# Patient Record
Sex: Female | Born: 1945 | Race: White | Hispanic: No | Marital: Married | Smoking: Former smoker
Health system: Southern US, Community
[De-identification: ages and names within clinical notes are randomized; demographics above are authoritative.]

## PROBLEM LIST (undated history)

## (undated) DIAGNOSIS — M1990 Unspecified osteoarthritis, unspecified site: Secondary | ICD-10-CM

## (undated) DIAGNOSIS — A0472 Enterocolitis due to Clostridium difficile, not specified as recurrent: Secondary | ICD-10-CM

## (undated) DIAGNOSIS — K219 Gastro-esophageal reflux disease without esophagitis: Secondary | ICD-10-CM

## (undated) DIAGNOSIS — B029 Zoster without complications: Secondary | ICD-10-CM

## (undated) DIAGNOSIS — K589 Irritable bowel syndrome without diarrhea: Secondary | ICD-10-CM

## (undated) DIAGNOSIS — I10 Essential (primary) hypertension: Secondary | ICD-10-CM

## (undated) DIAGNOSIS — J45909 Unspecified asthma, uncomplicated: Secondary | ICD-10-CM

## (undated) DIAGNOSIS — K5792 Diverticulitis of intestine, part unspecified, without perforation or abscess without bleeding: Secondary | ICD-10-CM

## (undated) DIAGNOSIS — R1032 Left lower quadrant pain: Secondary | ICD-10-CM

## (undated) DIAGNOSIS — G4730 Sleep apnea, unspecified: Secondary | ICD-10-CM

## (undated) DIAGNOSIS — R1011 Right upper quadrant pain: Secondary | ICD-10-CM

## (undated) HISTORY — PX: BACK SURGERY: SHX140

## (undated) HISTORY — PX: CHOLECYSTECTOMY: SHX55

## (undated) HISTORY — PX: EYE SURGERY: SHX253

## (undated) HISTORY — PX: CATARACT EXTRACTION, BILATERAL: SHX1313

---

## 2004-04-29 ENCOUNTER — Ambulatory Visit: Payer: Self-pay

## 2005-05-26 ENCOUNTER — Inpatient Hospital Stay: Payer: Self-pay | Admitting: Unknown Physician Specialty

## 2005-06-15 ENCOUNTER — Ambulatory Visit: Payer: Self-pay | Admitting: Unknown Physician Specialty

## 2005-09-06 ENCOUNTER — Ambulatory Visit: Payer: Self-pay

## 2006-09-07 ENCOUNTER — Ambulatory Visit: Payer: Self-pay

## 2007-10-01 ENCOUNTER — Ambulatory Visit: Payer: Self-pay

## 2008-01-10 ENCOUNTER — Ambulatory Visit: Payer: Self-pay | Admitting: Family Medicine

## 2008-02-08 ENCOUNTER — Ambulatory Visit: Payer: Self-pay | Admitting: Family Medicine

## 2008-07-31 ENCOUNTER — Ambulatory Visit: Payer: Self-pay

## 2008-10-27 ENCOUNTER — Ambulatory Visit: Payer: Self-pay

## 2010-02-01 ENCOUNTER — Ambulatory Visit: Payer: Self-pay

## 2010-02-19 ENCOUNTER — Ambulatory Visit: Payer: Self-pay

## 2010-03-16 ENCOUNTER — Ambulatory Visit: Payer: Self-pay | Admitting: Unknown Physician Specialty

## 2010-03-17 LAB — PATHOLOGY REPORT

## 2011-02-22 ENCOUNTER — Ambulatory Visit: Payer: Self-pay | Admitting: Obstetrics and Gynecology

## 2012-06-20 ENCOUNTER — Ambulatory Visit: Payer: Self-pay | Admitting: Obstetrics and Gynecology

## 2013-06-25 ENCOUNTER — Ambulatory Visit: Payer: Self-pay | Admitting: Physical Medicine and Rehabilitation

## 2013-10-08 ENCOUNTER — Ambulatory Visit: Payer: Self-pay | Admitting: Internal Medicine

## 2013-10-23 HISTORY — PX: COLONOSCOPY: SHX174

## 2014-02-27 ENCOUNTER — Other Ambulatory Visit: Payer: Self-pay | Admitting: Ophthalmology

## 2014-03-02 LAB — WOUND AEROBIC CULTURE

## 2014-08-30 ENCOUNTER — Ambulatory Visit: Payer: Self-pay | Admitting: Registered Nurse

## 2015-02-17 ENCOUNTER — Other Ambulatory Visit: Payer: Self-pay | Admitting: Internal Medicine

## 2015-02-17 DIAGNOSIS — Z1231 Encounter for screening mammogram for malignant neoplasm of breast: Secondary | ICD-10-CM

## 2015-02-25 ENCOUNTER — Ambulatory Visit
Admission: RE | Admit: 2015-02-25 | Discharge: 2015-02-25 | Disposition: A | Discharge status: Home / Self Care | Payer: Medicare Other | Source: Ambulatory Visit | Attending: Internal Medicine | Admitting: Internal Medicine

## 2015-02-25 DIAGNOSIS — Z1231 Encounter for screening mammogram for malignant neoplasm of breast: Secondary | ICD-10-CM

## 2017-02-07 ENCOUNTER — Other Ambulatory Visit: Payer: Self-pay | Admitting: Internal Medicine

## 2017-02-07 DIAGNOSIS — Z1231 Encounter for screening mammogram for malignant neoplasm of breast: Secondary | ICD-10-CM

## 2017-02-20 ENCOUNTER — Ambulatory Visit
Admission: RE | Admit: 2017-02-20 | Discharge: 2017-02-20 | Disposition: A | Discharge status: Home / Self Care | Payer: Medicare HMO | Source: Ambulatory Visit | Attending: Internal Medicine | Admitting: Internal Medicine

## 2017-02-20 DIAGNOSIS — Z1231 Encounter for screening mammogram for malignant neoplasm of breast: Secondary | ICD-10-CM

## 2017-03-30 ENCOUNTER — Other Ambulatory Visit: Payer: Self-pay | Admitting: Internal Medicine

## 2017-03-30 DIAGNOSIS — I6523 Occlusion and stenosis of bilateral carotid arteries: Secondary | ICD-10-CM

## 2017-04-10 ENCOUNTER — Ambulatory Visit
Admission: RE | Admit: 2017-04-10 | Discharge: 2017-04-10 | Disposition: A | Discharge status: Home / Self Care | Payer: Medicare HMO | Source: Ambulatory Visit | Attending: Internal Medicine | Admitting: Internal Medicine

## 2017-04-10 DIAGNOSIS — I6523 Occlusion and stenosis of bilateral carotid arteries: Secondary | ICD-10-CM | POA: Insufficient documentation

## 2017-10-29 ENCOUNTER — Other Ambulatory Visit: Payer: Self-pay

## 2017-10-29 ENCOUNTER — Encounter: Payer: Self-pay | Admitting: Gynecology

## 2017-10-29 ENCOUNTER — Ambulatory Visit
Admission: EM | Admit: 2017-10-29 | Discharge: 2017-10-29 | Disposition: A | Discharge status: Home / Self Care | Payer: Medicare HMO | Attending: Family Medicine | Admitting: Family Medicine

## 2017-10-29 DIAGNOSIS — R059 Cough, unspecified: Secondary | ICD-10-CM

## 2017-10-29 DIAGNOSIS — R05 Cough: Secondary | ICD-10-CM

## 2017-10-29 DIAGNOSIS — J0140 Acute pansinusitis, unspecified: Secondary | ICD-10-CM

## 2017-10-29 HISTORY — DX: Enterocolitis due to Clostridium difficile, not specified as recurrent: A04.72

## 2017-10-29 HISTORY — DX: Essential (primary) hypertension: I10

## 2017-10-29 HISTORY — DX: Zoster without complications: B02.9

## 2017-10-29 HISTORY — DX: Diverticulitis of intestine, part unspecified, without perforation or abscess without bleeding: K57.92

## 2017-10-29 MED ORDER — AMOXICILLIN-POT CLAVULANATE 875-125 MG PO TABS: 1.0000 | ORAL_TABLET | Freq: Two times a day (BID) | ORAL | 0 refills | Status: AC

## 2017-10-29 MED ORDER — FLUCONAZOLE 150 MG PO TABS: 150.0000 mg | ORAL_TABLET | Freq: Once | ORAL | 0 refills | Status: AC

## 2017-10-29 NOTE — Discharge Instructions (Addendum)
Recommend start Augmentin 875mg  twice a day as directed. Increase fluid intake to help loosen up mucus. May continue Mucinex as needed for cough. Also recommend take Diflucan 150mg  one tablet tomorrow, may repeat 1 tablet at end of antibiotic use if needed. Follow-up with your PCP in 3 to 4 days if not improving.

## 2017-10-29 NOTE — ED Triage Notes (Signed)
Patient c/o cough x couple days.

## 2017-10-29 NOTE — ED Provider Notes (Signed)
[facility]    CSN: [id number] Arrival date & time: 10/29/17  1311     History   Chief Complaint Chief Complaint  Patient presents with  . Cough    HPI Julie Davis is a 72 y.o. female.   72 year old female accompanied by her daughter with concern over nasal congestion and cough that has been occurring for the past 2 weeks. Thought is may be allergies at first but now cough and congestion have gotten worse over the past 2-3 days. Also has had decreased energy and appetite along with nausea and irritated throat from coughing. Denies any fever or vomiting. Has taken Mucinex and Aleve or Tylenol OTC cold medication with minimal relief. Other chronic health issues include HTN, Diverticulitis and Osteoporosis along with dementia. Currently on Lisinopril-HCTZ, Toprol, Fosamax, Aricept, Protonix and Xanax prn.   The history is provided by the patient and a caregiver.    Past Medical History:  Diagnosis Date  . C. difficile colitis   . Diverticulitis   . Hypertension   . Shingles     There are no active problems to display for this patient.   Past Surgical History:  Procedure Laterality Date  . CHOLECYSTECTOMY    . EYE SURGERY      OB History   None      Home Medications    Prior to Admission medications   Medication Sig Start Date End Date Taking? Authorizing Provider  alendronate (FOSAMAX) 70 MG tablet Take 70 mg by mouth once a week. Take with a full glass of water on an empty stomach.   Yes [provider]  ALPRAZolam (XANAX) 0.25 MG tablet Take 0.25 mg by mouth at bedtime as needed for anxiety.   Yes [provider]  donepezil (ARICEPT) 5 MG tablet Take 5 mg by mouth at bedtime.   Yes [provider]  lisinopril-hydrochlorothiazide (PRINZIDE,ZESTORETIC) 20-12.5 MG tablet Take 1 tablet by mouth daily.   Yes [provider]  methocarbamol (ROBAXIN) 500 MG tablet Take 500 mg by mouth 4 (four) times daily.   Yes  [provider]  metoprolol succinate (TOPROL-XL) 50 MG 24 hr tablet Take 50 mg by mouth daily. Take with or immediately following a meal.   Yes [provider]  pantoprazole (PROTONIX) 40 MG tablet Take 40 mg by mouth daily.   Yes [provider]  traMADol (ULTRAM) 50 MG tablet Take by mouth every 6 (six) hours as needed.   Yes [provider]  amoxicillin-clavulanate (AUGMENTIN) 875-125 MG tablet Take 1 tablet by mouth every 12 (twelve) hours for 7 days. 10/29/17 11/05/17  Sudie Grumbling, NP  fluconazole (DIFLUCAN) 150 MG tablet Take 1 tablet (150 mg total) by mouth once for 1 dose. May repeat 1 tablet in 3 to 4 days if needed. 10/29/17 10/29/17  Sudie Grumbling, NP    Family History Family History  Problem Relation Age of Onset  . Breast cancer Father 45    Social History Social History   Tobacco Use  . Smoking status: Never Smoker  . Smokeless tobacco: Never Used  Substance Use Topics  . Alcohol use: Never    Frequency: Never  . Drug use: Never     Allergies   Patient has no known allergies.   Review of Systems Review of Systems  Constitutional: Positive for activity change, appetite change and fatigue. Negative for chills, diaphoresis and fever.  HENT: Positive for congestion, postnasal drip, rhinorrhea, sinus pressure, sinus  pain and sore throat. Negative for ear discharge, ear pain, facial swelling, mouth sores, nosebleeds and trouble swallowing.   Eyes: Negative for pain, discharge, redness and itching.  Respiratory: Positive for cough. Negative for chest tightness, shortness of breath and wheezing.   Gastrointestinal: Positive for nausea. Negative for abdominal pain, diarrhea and vomiting.  Musculoskeletal: Negative for arthralgias, myalgias, neck pain and neck stiffness.  Skin: Negative for rash and wound.  Neurological: Positive for headaches. Negative for dizziness, seizures, syncope, weakness and light-headedness.  Hematological:  Negative for adenopathy. Does not bruise/bleed easily.  Psychiatric/Behavioral: Positive for confusion.     Physical Exam Triage Vital Signs ED Triage Vitals  Enc Vitals Group     BP 10/29/17 1326 (!) 151/118     Pulse Rate 10/29/17 1326 (!) 50     Resp 10/29/17 1326 16     Temp 10/29/17 1326 98 F (36.7 C)     Temp Source 10/29/17 1326 Oral     SpO2 10/29/17 1326 98 %     Weight 10/29/17 1328 160 lb (72.6 kg)     Height 10/29/17 1328 5\' 3"  (1.6 m)     Head Circumference --      Peak Flow --      Pain Score 10/29/17 1327 1     Pain Loc --      Pain Edu? --      Excl. in GC? --    No data found.  Updated Vital Signs BP (!) 151/118 (BP Location: Right Arm)   Pulse (!) 50   Temp 98 F (36.7 C) (Oral)   Resp 16   Ht 5\' 3"  (1.6 m)   Wt 160 lb (72.6 kg)   SpO2 98%   BMI 28.34 kg/m   Visual Acuity Right Eye Distance:   Left Eye Distance:   Bilateral Distance:    Right Eye Near:   Left Eye Near:    Bilateral Near:     Physical Exam  Constitutional: She appears well-developed and well-nourished. She is cooperative. No distress.  Patient sitting in exam chair in no acute distress.   HENT:  Head: Normocephalic and atraumatic.  Right Ear: Hearing, tympanic membrane, external ear and ear canal normal.  Left Ear: Hearing, tympanic membrane, external ear and ear canal normal.  Nose: Mucosal edema and rhinorrhea present. Right sinus exhibits maxillary sinus tenderness and frontal sinus tenderness. Left sinus exhibits maxillary sinus tenderness and frontal sinus tenderness.  Mouth/Throat: Uvula is midline and mucous membranes are normal. Oropharyngeal exudate (yellow) and posterior oropharyngeal erythema present.  Yellowish brown cerumen present in both ear canals but not completely blocking canal.   Eyes: Conjunctivae and EOM are normal.  Neck: Normal range of motion. Neck supple.  Cardiovascular: Normal rate, regular rhythm and normal heart sounds.  No murmur  heard. Pulmonary/Chest: Effort normal. No stridor. No respiratory distress. She has no decreased breath sounds. She has no wheezes. She has rhonchi in the right upper field and the left upper field. She has no rales.  Musculoskeletal: Normal range of motion.  Lymphadenopathy:    She has no cervical adenopathy.  Neurological: She is alert.  Skin: Skin is warm and dry. Capillary refill takes less than 2 seconds. No rash noted.  Psychiatric: She has a normal mood and affect. Her speech is normal and behavior is normal. Thought content normal.  Vitals reviewed.    UC Treatments / Results  Labs (all labs ordered are listed, but only abnormal results are displayed) Labs  Reviewed - No data to display  EKG None Radiology No results found.  Procedures Procedures (including critical care time)  Medications Ordered in UC Medications - No data to display   Initial Impression / Assessment and Plan / UC Course  I have reviewed the triage vital signs and the nursing notes.  Pertinent labs & imaging results that were available during my care of the patient were reviewed by me and considered in my medical decision making (see chart for details).    Discussed with patient and daughter that she probably has an early sinus infection. Since symptoms are getting worse will treat with antibiotics. May start Augmentin 875mg  twice a day as directed. May take Diflucan 150mg  once tomorrow to help prevent antibiotic-induced yeast vaginitis. May repeat 1 tablet at end of antibiotic use if needed. May continue Mucinex as directed for cough. Increase fluid intake to help loosen up mucus. Follow-up with her PCP in 3 to 4 days if not improving.   Final Clinical Impressions(s) / UC Diagnoses   Final diagnoses:  Acute non-recurrent pansinusitis  Cough    ED Discharge Orders        Ordered    amoxicillin-clavulanate (AUGMENTIN) 875-125 MG tablet  Every 12 hours     10/29/17 1349    fluconazole (DIFLUCAN)  150 MG tablet   Once     10/29/17 1349       Controlled Substance Prescriptions [place] Controlled Substance Registry consulted? Not Applicable   12/29/17, NP 10/29/17 1404

## 2018-04-13 ENCOUNTER — Other Ambulatory Visit: Payer: Self-pay | Admitting: Internal Medicine

## 2018-04-13 DIAGNOSIS — Z1231 Encounter for screening mammogram for malignant neoplasm of breast: Secondary | ICD-10-CM

## 2018-04-18 ENCOUNTER — Ambulatory Visit
Admission: RE | Admit: 2018-04-18 | Discharge: 2018-04-18 | Disposition: A | Discharge status: Home / Self Care | Payer: Medicare HMO | Source: Ambulatory Visit | Attending: Internal Medicine | Admitting: Internal Medicine

## 2018-04-18 DIAGNOSIS — Z1231 Encounter for screening mammogram for malignant neoplasm of breast: Secondary | ICD-10-CM | POA: Insufficient documentation

## 2018-04-19 ENCOUNTER — Other Ambulatory Visit: Payer: Self-pay | Admitting: Nurse Practitioner

## 2018-04-19 DIAGNOSIS — R1032 Left lower quadrant pain: Secondary | ICD-10-CM

## 2018-04-25 ENCOUNTER — Ambulatory Visit
Admission: RE | Admit: 2018-04-25 | Discharge: 2018-04-25 | Disposition: A | Discharge status: Home / Self Care | Payer: Medicare HMO | Source: Ambulatory Visit | Attending: Nurse Practitioner | Admitting: Nurse Practitioner

## 2018-04-25 DIAGNOSIS — R1032 Left lower quadrant pain: Secondary | ICD-10-CM | POA: Diagnosis present

## 2018-04-25 DIAGNOSIS — K760 Fatty (change of) liver, not elsewhere classified: Secondary | ICD-10-CM | POA: Diagnosis not present

## 2018-04-25 DIAGNOSIS — R1011 Right upper quadrant pain: Secondary | ICD-10-CM | POA: Insufficient documentation

## 2018-04-25 DIAGNOSIS — K5730 Diverticulosis of large intestine without perforation or abscess without bleeding: Secondary | ICD-10-CM | POA: Insufficient documentation

## 2018-04-25 MED ORDER — IOHEXOL 300 MG/ML  SOLN
150.0000 mL | Freq: Once | INTRAMUSCULAR | Status: AC | PRN
  Administered 2018-04-25: 100 mL via INTRAVENOUS

## 2018-06-20 ENCOUNTER — Encounter: Payer: Self-pay | Admitting: *Deleted

## 2018-06-25 ENCOUNTER — Ambulatory Visit: Payer: Medicare HMO | Admitting: Anesthesiology

## 2018-06-25 ENCOUNTER — Encounter: Payer: Self-pay | Admitting: *Deleted

## 2018-06-25 ENCOUNTER — Encounter
Admission: RE | Disposition: A | Discharge status: Home / Self Care | Payer: Self-pay | Source: Ambulatory Visit | Attending: Unknown Physician Specialty

## 2018-06-25 ENCOUNTER — Ambulatory Visit
Admission: RE | Admit: 2018-06-25 | Discharge: 2018-06-25 | Disposition: A | Discharge status: Home / Self Care | Payer: Medicare HMO | Source: Ambulatory Visit | Attending: Unknown Physician Specialty | Admitting: Unknown Physician Specialty

## 2018-06-25 DIAGNOSIS — K269 Duodenal ulcer, unspecified as acute or chronic, without hemorrhage or perforation: Secondary | ICD-10-CM | POA: Diagnosis not present

## 2018-06-25 DIAGNOSIS — Z1211 Encounter for screening for malignant neoplasm of colon: Secondary | ICD-10-CM | POA: Insufficient documentation

## 2018-06-25 DIAGNOSIS — Z86010 Personal history of colonic polyps: Secondary | ICD-10-CM | POA: Insufficient documentation

## 2018-06-25 DIAGNOSIS — Z87891 Personal history of nicotine dependence: Secondary | ICD-10-CM | POA: Diagnosis not present

## 2018-06-25 DIAGNOSIS — K219 Gastro-esophageal reflux disease without esophagitis: Secondary | ICD-10-CM | POA: Insufficient documentation

## 2018-06-25 DIAGNOSIS — M1990 Unspecified osteoarthritis, unspecified site: Secondary | ICD-10-CM | POA: Diagnosis not present

## 2018-06-25 DIAGNOSIS — J45909 Unspecified asthma, uncomplicated: Secondary | ICD-10-CM | POA: Insufficient documentation

## 2018-06-25 DIAGNOSIS — Z791 Long term (current) use of non-steroidal anti-inflammatories (NSAID): Secondary | ICD-10-CM | POA: Diagnosis not present

## 2018-06-25 DIAGNOSIS — I10 Essential (primary) hypertension: Secondary | ICD-10-CM | POA: Insufficient documentation

## 2018-06-25 DIAGNOSIS — K640 First degree hemorrhoids: Secondary | ICD-10-CM | POA: Diagnosis not present

## 2018-06-25 DIAGNOSIS — G4730 Sleep apnea, unspecified: Secondary | ICD-10-CM | POA: Diagnosis not present

## 2018-06-25 DIAGNOSIS — K2980 Duodenitis without bleeding: Secondary | ICD-10-CM | POA: Diagnosis not present

## 2018-06-25 DIAGNOSIS — K2970 Gastritis, unspecified, without bleeding: Secondary | ICD-10-CM | POA: Insufficient documentation

## 2018-06-25 DIAGNOSIS — K5730 Diverticulosis of large intestine without perforation or abscess without bleeding: Secondary | ICD-10-CM | POA: Insufficient documentation

## 2018-06-25 HISTORY — DX: Unspecified asthma, uncomplicated: J45.909

## 2018-06-25 HISTORY — PX: COLONOSCOPY WITH PROPOFOL: SHX5780

## 2018-06-25 HISTORY — DX: Sleep apnea, unspecified: G47.30

## 2018-06-25 HISTORY — DX: Right upper quadrant pain: R10.11

## 2018-06-25 HISTORY — DX: Gastro-esophageal reflux disease without esophagitis: K21.9

## 2018-06-25 HISTORY — DX: Left lower quadrant pain: R10.32

## 2018-06-25 HISTORY — DX: Irritable bowel syndrome, unspecified: K58.9

## 2018-06-25 HISTORY — PX: ESOPHAGOGASTRODUODENOSCOPY (EGD) WITH PROPOFOL: SHX5813

## 2018-06-25 HISTORY — DX: Unspecified osteoarthritis, unspecified site: M19.90

## 2018-06-25 SURGERY — ESOPHAGOGASTRODUODENOSCOPY (EGD) WITH PROPOFOL
Anesthesia: General

## 2018-06-25 MED ORDER — SODIUM CHLORIDE 0.9 % IV SOLN: INTRAVENOUS | Status: DC

## 2018-06-25 MED ORDER — FENTANYL CITRATE (PF) 100 MCG/2ML IJ SOLN
INTRAMUSCULAR | Status: AC
  Filled 2018-06-25: qty 2

## 2018-06-25 MED ORDER — LIDOCAINE HCL (PF) 2 % IJ SOLN
INTRAMUSCULAR | Status: AC
  Filled 2018-06-25: qty 10

## 2018-06-25 MED ORDER — MIDAZOLAM HCL 5 MG/5ML IJ SOLN
INTRAMUSCULAR | Status: DC | PRN
  Administered 2018-06-25: 0.5 mg via INTRAVENOUS
  Administered 2018-06-25: 1 mg via INTRAVENOUS
  Administered 2018-06-25: 0.5 mg via INTRAVENOUS

## 2018-06-25 MED ORDER — LIDOCAINE HCL (PF) 2 % IJ SOLN
INTRAMUSCULAR | Status: DC | PRN
  Administered 2018-06-25: 80 mg

## 2018-06-25 MED ORDER — PROPOFOL 10 MG/ML IV BOLUS
INTRAVENOUS | Status: DC | PRN
  Administered 2018-06-25: 10 mg via INTRAVENOUS
  Administered 2018-06-25: 30 mg via INTRAVENOUS
  Administered 2018-06-25 (×3): 20 mg via INTRAVENOUS

## 2018-06-25 MED ORDER — PROPOFOL 500 MG/50ML IV EMUL
INTRAVENOUS | Status: AC
  Filled 2018-06-25: qty 50

## 2018-06-25 MED ORDER — MIDAZOLAM HCL 2 MG/2ML IJ SOLN
INTRAMUSCULAR | Status: AC
  Filled 2018-06-25: qty 2

## 2018-06-25 MED ORDER — PROPOFOL 500 MG/50ML IV EMUL
INTRAVENOUS | Status: DC | PRN
  Administered 2018-06-25: 50 ug/kg/min via INTRAVENOUS

## 2018-06-25 MED ORDER — FENTANYL CITRATE (PF) 100 MCG/2ML IJ SOLN
INTRAMUSCULAR | Status: DC | PRN
  Administered 2018-06-25 (×2): 25 ug via INTRAVENOUS
  Administered 2018-06-25: 50 ug via INTRAVENOUS

## 2018-06-25 MED ORDER — SODIUM CHLORIDE 0.9 % IV SOLN
INTRAVENOUS | Status: DC
  Administered 2018-06-25: 14:00:00 via INTRAVENOUS
  Administered 2018-06-25: 1000 mL via INTRAVENOUS

## 2018-06-25 NOTE — Op Note (Signed)
[facility] Gastroenterology Patient Name: Julie Davis Procedure Date: 06/25/2018 1:46 PM MRN: [id number] Account #: [id number] Date of Birth: 19-Jan-1946 Admit Type: Outpatient Age: 72 Room: [facility] ENDO ROOM 1 Gender: Female Note Status: Finalized Procedure:            Upper GI endoscopy Indications:          Suspected gastro-esophageal reflux disease Providers:            Scot Jun, MD Referring MD:         Danella Penton, MD (Referring MD) Medicines:            Propofol per Anesthesia Complications:        No immediate complications. Procedure:            Pre-Anesthesia Assessment:                       - After reviewing the risks and benefits, the patient                        was deemed in satisfactory condition to undergo the                        procedure.                       After obtaining informed consent, the endoscope was                        passed under direct vision. Throughout the procedure,                        the patient's blood pressure, pulse, and oxygen                        saturations were monitored continuously. The Endoscope                        was introduced through the mouth, and advanced to the                        second part of duodenum. The upper GI endoscopy was                        accomplished without difficulty. The patient tolerated                        the procedure well. Findings:      The examined esophagus was normal. 38cm to GEJ.      Scattered minimal inflammation characterized by erythema, granularity       and linear erosions was found in the gastric body. Biopsies were taken       with a cold forceps for histology.      Few non-bleeding scattered cratered duodenal ulcers with no stigmata of       bleeding were found in the duodenal bulb. The largest lesion was 3 mm in       largest dimension. Biopsies were taken with a cold forceps for histology. Impression:           - Normal esophagus.                       -  Gastritis. Biopsied.                       - Multiple non-bleeding duodenal ulcers with no                        stigmata of bleeding. Biopsied. Recommendation:       - Await pathology results. Scot Jun, MD 06/25/2018 2:03:51 PM This report has been signed electronically. Number of Addenda: 0 Note Initiated On: 06/25/2018 1:46 PM      [facility]

## 2018-06-25 NOTE — Op Note (Signed)
[facility] Gastroenterology Patient Name: Julie Davis Procedure Date: 06/25/2018 1:45 PM MRN: [id number] Account #: [id number] Date of Birth: [birth year] Admit Type: Outpatient Age: 72 Room: [facility] ENDO ROOM 1 Gender: Female Note Status: Finalized Procedure:            Colonoscopy Indications:          High risk colon cancer surveillance: Personal history                        of colonic polyps Providers:            Scot Jun, MD Referring MD:         Danella Penton, MD (Referring MD) Medicines:            Propofol per Anesthesia Complications:        No immediate complications. Procedure:            Pre-Anesthesia Assessment:                       - After reviewing the risks and benefits, the patient                        was deemed in satisfactory condition to undergo the                        procedure.                       After obtaining informed consent, the colonoscope was                        passed under direct vision. Throughout the procedure,                        the patient's blood pressure, pulse, and oxygen                        saturations were monitored continuously. The                        Colonoscope was introduced through the anus and                        advanced to the the cecum, identified by appendiceal                        orifice and ileocecal valve. The colonoscopy was                        performed without difficulty. The patient tolerated the                        procedure well. The quality of the bowel preparation                        was excellent. Findings:      Multiple small and large-mouthed diverticula were found in the sigmoid       colon, descending colon and transverse colon.      The exam was otherwise without abnormality.      Internal hemorrhoids were found during endoscopy. The hemorrhoids were  small and Grade I (internal hemorrhoids that do not prolapse). Impression:           -  Diverticulosis in the sigmoid colon, in the                        descending colon and in the transverse colon.                       - The examination was otherwise normal.                       - Internal hemorrhoids.                       - No specimens collected. Recommendation:       - Repeat colonoscopy in 5 years for screening purposes. Scot Jun, MD 06/25/2018 2:23:41 PM This report has been signed electronically. Number of Addenda: 0 Note Initiated On: 06/25/2018 1:45 PM Scope Withdrawal Time: 0 hours 5 minutes 23 seconds  Total Procedure Duration: 0 hours 13 minutes 23 seconds       [facility]

## 2018-06-25 NOTE — Anesthesia Preprocedure Evaluation (Addendum)
Anesthesia Evaluation  Patient identified by MRN, date of birth, ID band Patient awake    Reviewed: Allergy & Precautions, H&P , NPO status , Patient's Chart, lab work & pertinent test results  Airway Mallampati: II       Dental  (+) Upper Dentures, Lower Dentures   Pulmonary sleep apnea , former smoker (quit 30 years ago),           Cardiovascular hypertension,      Neuro/Psych TIA (30 years ago)negative psych ROS   GI/Hepatic Neg liver ROS, GERD  ,  Endo/Other  negative endocrine ROS  Renal/GU negative Renal ROS  negative genitourinary   Musculoskeletal  (+) Arthritis ,   Abdominal   Peds  Hematology negative hematology ROS (+)   Anesthesia Other Findings Past Medical History: No date: Abdominal pain, LLQ No date: Abdominal pain, RUQ No date: Arthritis No date: Asthma No date: C. difficile colitis No date: Diverticulitis No date: GERD (gastroesophageal reflux disease) No date: Hypertension No date: IBS (irritable bowel syndrome) No date: Shingles No date: Sleep apnea  Past Surgical History: No date: BACK SURGERY     Comment:  laminectomy posterior lumbar facetectomy No date: CATARACT EXTRACTION, BILATERAL No date: CHOLECYSTECTOMY 10/2013: COLONOSCOPY No date: EYE SURGERY  BMI    Body Mass Index:  27.99 kg/m      Reproductive/Obstetrics negative OB ROS                            Anesthesia Physical Anesthesia Plan  ASA: III  Anesthesia Plan: General   Post-op Pain Management:    Induction:   PONV Risk Score and Plan: Propofol infusion and TIVA  Airway Management Planned: Natural Airway and Nasal Cannula  Additional Equipment:   Intra-op Plan:   Post-operative Plan:   Informed Consent: I have reviewed the patients History and Physical, chart, labs and discussed the procedure including the risks, benefits and alternatives for the proposed anesthesia with the  patient or authorized representative who has indicated his/her understanding and acceptance.   Dental Advisory Given  Plan Discussed with: Anesthesiologist  Anesthesia Plan Comments:         Anesthesia Quick Evaluation

## 2018-06-25 NOTE — Transfer of Care (Signed)
Immediate Anesthesia Transfer of Care Note  Patient: Julie Davis  Procedure(s) Performed: ESOPHAGOGASTRODUODENOSCOPY (EGD) WITH PROPOFOL (N/A ) COLONOSCOPY WITH PROPOFOL (N/A )  Patient Location: PACU  Anesthesia Type:General  Level of Consciousness: awake and alert   Airway & Oxygen Therapy: Patient Spontanous Breathing  Post-op Assessment: Report given to RN and Post -op Vital signs reviewed and stable  Post vital signs: Reviewed and stable  Last Vitals:  Vitals Value Taken Time  BP    Temp    Pulse 70 06/25/2018  2:23 PM  Resp 17 06/25/2018  2:24 PM  SpO2 93 % 06/25/2018  2:23 PM  Vitals shown include unvalidated device data.  Last Pain:  Vitals:   06/25/18 1222  TempSrc: Tympanic  PainSc: 0-No pain         Complications: No apparent anesthesia complications

## 2018-06-25 NOTE — Anesthesia Post-op Follow-up Note (Signed)
Anesthesia QCDR form completed.        

## 2018-06-25 NOTE — H&P (Signed)
Primary Care Physician:  Danella Penton, MD Primary Gastroenterologist:  Dr. Mechele Collin  Pre-Procedure History & Physical: HPI:  Julie Davis is a 72 y.o. female is here for an endoscopy and colonoscopy.   Past Medical History:  Diagnosis Date  . Abdominal pain, LLQ   . Abdominal pain, RUQ   . Arthritis   . Asthma   . C. difficile colitis   . Diverticulitis   . GERD (gastroesophageal reflux disease)   . Hypertension   . IBS (irritable bowel syndrome)   . Shingles   . Sleep apnea     Past Surgical History:  Procedure Laterality Date  . BACK SURGERY     laminectomy posterior lumbar facetectomy  . CATARACT EXTRACTION, BILATERAL    . CHOLECYSTECTOMY    . COLONOSCOPY  10/2013  . EYE SURGERY      Prior to Admission medications   Medication Sig Start Date End Date Taking? Authorizing Provider  alendronate (FOSAMAX) 70 MG tablet Take 70 mg by mouth once a week. Take with a full glass of water on an empty stomach.   Yes [provider]  ALPRAZolam (XANAX) 0.25 MG tablet Take 0.25 mg by mouth at bedtime as needed for anxiety.   Yes [provider]  atorvastatin (LIPITOR) 10 MG tablet Take 10 mg by mouth daily.   Yes [provider]  donepezil (ARICEPT) 5 MG tablet Take 5 mg by mouth at bedtime.   Yes [provider]  lisinopril-hydrochlorothiazide (PRINZIDE,ZESTORETIC) 20-12.5 MG tablet Take 1 tablet by mouth daily.   Yes [provider]  meloxicam (MOBIC) 15 MG tablet Take 15 mg by mouth daily.   Yes [provider]  methocarbamol (ROBAXIN) 500 MG tablet Take 500 mg by mouth 4 (four) times daily.   Yes [provider]  metoprolol succinate (TOPROL-XL) 50 MG 24 hr tablet Take 50 mg by mouth daily. Take with or immediately following a meal.   Yes [provider]  metoprolol tartrate (LOPRESSOR) 25 MG tablet Take 25 mg by mouth 2 (two) times daily.   Yes [provider]  pantoprazole (PROTONIX) 40 MG  tablet Take 40 mg by mouth daily.   Yes [provider]  traMADol (ULTRAM) 50 MG tablet Take by mouth every 6 (six) hours as needed.   Yes [provider]    Allergies as of 06/01/2018  . (No Known Allergies)    Family History  Problem Relation Age of Onset  . Breast cancer Father 46    Social History   Socioeconomic History  . Marital status: Married    Spouse name: Not on file  . Number of children: Not on file  . Years of education: Not on file  . Highest education level: Not on file  Occupational History  . Not on file  Social Needs  . Financial resource strain: Not on file  . Food insecurity:    Worry: Not on file    Inability: Not on file  . Transportation needs:    Medical: Not on file    Non-medical: Not on file  Tobacco Use  . Smoking status: Former Games developer  . Smokeless tobacco: Never Used  Substance and Sexual Activity  . Alcohol use: Never    Frequency: Never  . Drug use: Never  . Sexual activity: Not on file  Lifestyle  . Physical activity:    Days per week: Not on file    Minutes per session: Not on file  .  Stress: Not on file  Relationships  . Social connections:    Talks on phone: Not on file    Gets together: Not on file    Attends religious service: Not on file    Active member of club or organization: Not on file    Attends meetings of clubs or organizations: Not on file    Relationship status: Not on file  . Intimate partner violence:    Fear of current or ex partner: Not on file    Emotionally abused: Not on file    Physically abused: Not on file    Forced sexual activity: Not on file  Other Topics Concern  . Not on file  Social History Narrative  . Not on file    Review of Systems: See HPI, otherwise negative ROS  Physical Exam: BP (!) 197/90   Pulse 79   Temp 97.7 F (36.5 C) (Tympanic)   Resp 18   Ht 5\' 3"  (1.6 m)   Wt 71.7 kg   SpO2 98%   BMI 27.99 kg/m  General:   Alert,  pleasant and cooperative in  NAD Head:  Normocephalic and atraumatic. Neck:  Supple; no masses or thyromegaly. Lungs:  Clear throughout to auscultation.    Heart:  Regular rate and rhythm. Abdomen:  Soft, nontender and nondistended. Normal bowel sounds, without guarding, and without rebound.   Neurologic:  Alert and  oriented x4;  grossly normal neurologically.  Impression/Plan: Julie Davis is here for an endoscopy and colonoscopy to be performed for [facility] colon polyps and GERD.  Risks, benefits, limitations, and alternatives regarding  endoscopy and colonoscopy have been reviewed with the patient.  Questions have been answered.  All parties agreeable.   [facility], MD  06/25/2018, 1:46 PM

## 2018-06-26 NOTE — Anesthesia Postprocedure Evaluation (Signed)
Anesthesia Post Note  Patient: DEBARAH MCCUMBERS  Procedure(s) Performed: ESOPHAGOGASTRODUODENOSCOPY (EGD) WITH PROPOFOL (N/A ) COLONOSCOPY WITH PROPOFOL (N/A )  Patient location during evaluation: PACU Anesthesia Type: General Level of consciousness: awake and alert Pain management: pain level controlled Vital Signs Assessment: post-procedure vital signs reviewed and stable Respiratory status: spontaneous breathing, nonlabored ventilation, respiratory function stable and patient connected to nasal cannula oxygen Cardiovascular status: blood pressure returned to baseline and stable Postop Assessment: no apparent nausea or vomiting Anesthetic complications: no     Last Vitals:  Vitals:   06/25/18 1443 06/25/18 1453  BP: (!) 162/59 (!) 205/68  Pulse:    Resp:    Temp:    SpO2:      Last Pain:  Vitals:   06/25/18 1453  TempSrc:   PainSc: 0-No pain                 Jovita Gamma

## 2018-06-27 ENCOUNTER — Encounter: Payer: Self-pay | Admitting: Unknown Physician Specialty

## 2018-06-27 LAB — SURGICAL PATHOLOGY

## 2020-03-19 ENCOUNTER — Encounter: Payer: Self-pay | Admitting: *Deleted

## 2020-03-19 ENCOUNTER — Emergency Department: Payer: Medicare HMO

## 2020-03-19 ENCOUNTER — Other Ambulatory Visit: Payer: Self-pay

## 2020-03-19 DIAGNOSIS — F419 Anxiety disorder, unspecified: Secondary | ICD-10-CM | POA: Diagnosis present

## 2020-03-19 DIAGNOSIS — N179 Acute kidney failure, unspecified: Secondary | ICD-10-CM | POA: Diagnosis present

## 2020-03-19 DIAGNOSIS — I129 Hypertensive chronic kidney disease with stage 1 through stage 4 chronic kidney disease, or unspecified chronic kidney disease: Secondary | ICD-10-CM | POA: Diagnosis present

## 2020-03-19 DIAGNOSIS — Z888 Allergy status to other drugs, medicaments and biological substances status: Secondary | ICD-10-CM

## 2020-03-19 DIAGNOSIS — Z79891 Long term (current) use of opiate analgesic: Secondary | ICD-10-CM

## 2020-03-19 DIAGNOSIS — Z87891 Personal history of nicotine dependence: Secondary | ICD-10-CM

## 2020-03-19 DIAGNOSIS — Y929 Unspecified place or not applicable: Secondary | ICD-10-CM

## 2020-03-19 DIAGNOSIS — S0003XA Contusion of scalp, initial encounter: Secondary | ICD-10-CM | POA: Diagnosis present

## 2020-03-19 DIAGNOSIS — Z20822 Contact with and (suspected) exposure to covid-19: Secondary | ICD-10-CM | POA: Diagnosis present

## 2020-03-19 DIAGNOSIS — R001 Bradycardia, unspecified: Secondary | ICD-10-CM | POA: Diagnosis present

## 2020-03-19 DIAGNOSIS — E876 Hypokalemia: Secondary | ICD-10-CM | POA: Diagnosis not present

## 2020-03-19 DIAGNOSIS — M1990 Unspecified osteoarthritis, unspecified site: Secondary | ICD-10-CM | POA: Diagnosis present

## 2020-03-19 DIAGNOSIS — Z66 Do not resuscitate: Secondary | ICD-10-CM | POA: Diagnosis present

## 2020-03-19 DIAGNOSIS — J45909 Unspecified asthma, uncomplicated: Secondary | ICD-10-CM | POA: Diagnosis present

## 2020-03-19 DIAGNOSIS — Z9842 Cataract extraction status, left eye: Secondary | ICD-10-CM

## 2020-03-19 DIAGNOSIS — Z79899 Other long term (current) drug therapy: Secondary | ICD-10-CM

## 2020-03-19 DIAGNOSIS — R55 Syncope and collapse: Secondary | ICD-10-CM | POA: Diagnosis not present

## 2020-03-19 DIAGNOSIS — Z7983 Long term (current) use of bisphosphonates: Secondary | ICD-10-CM

## 2020-03-19 DIAGNOSIS — E860 Dehydration: Secondary | ICD-10-CM | POA: Diagnosis present

## 2020-03-19 DIAGNOSIS — F0390 Unspecified dementia without behavioral disturbance: Secondary | ICD-10-CM | POA: Diagnosis present

## 2020-03-19 DIAGNOSIS — Z9841 Cataract extraction status, right eye: Secondary | ICD-10-CM

## 2020-03-19 DIAGNOSIS — W1839XA Other fall on same level, initial encounter: Secondary | ICD-10-CM | POA: Diagnosis present

## 2020-03-19 DIAGNOSIS — Y998 Other external cause status: Secondary | ICD-10-CM

## 2020-03-19 DIAGNOSIS — K219 Gastro-esophageal reflux disease without esophagitis: Secondary | ICD-10-CM | POA: Diagnosis present

## 2020-03-19 DIAGNOSIS — Z9181 History of falling: Secondary | ICD-10-CM

## 2020-03-19 DIAGNOSIS — E785 Hyperlipidemia, unspecified: Secondary | ICD-10-CM | POA: Diagnosis present

## 2020-03-19 DIAGNOSIS — N189 Chronic kidney disease, unspecified: Secondary | ICD-10-CM | POA: Diagnosis present

## 2020-03-19 DIAGNOSIS — Z803 Family history of malignant neoplasm of breast: Secondary | ICD-10-CM

## 2020-03-19 LAB — CBC
HCT: 41.2 % (ref 36.0–46.0)
Hemoglobin: 14.2 g/dL (ref 12.0–15.0)
MCH: 30.7 pg (ref 26.0–34.0)
MCHC: 34.5 g/dL (ref 30.0–36.0)
MCV: 89 fL (ref 80.0–100.0)
Platelets: 199 10*3/uL (ref 150–400)
RBC: 4.63 MIL/uL (ref 3.87–5.11)
RDW: 12.2 % (ref 11.5–15.5)
WBC: 8.3 10*3/uL (ref 4.0–10.5)
nRBC: 0 % (ref 0.0–0.2)

## 2020-03-19 LAB — TROPONIN I (HIGH SENSITIVITY)
Troponin I (High Sensitivity): 21 ng/L — ABNORMAL HIGH (ref <18)
Troponin I (High Sensitivity): 29 ng/L — ABNORMAL HIGH (ref <18)

## 2020-03-19 LAB — BASIC METABOLIC PANEL
Anion gap: 12 (ref 5–15)
BUN: 16 mg/dL (ref 8–23)
CO2: 29 mmol/L (ref 22–32)
Calcium: 8.6 mg/dL — ABNORMAL LOW (ref 8.9–10.3)
Chloride: 98 mmol/L (ref 98–111)
Creatinine, Ser: 1.34 mg/dL — ABNORMAL HIGH (ref 0.44–1.00)
GFR calc Af Amer: 45 mL/min — ABNORMAL LOW (ref >60)
GFR calc non Af Amer: 39 mL/min — ABNORMAL LOW (ref >60)
Glucose, Bld: 130 mg/dL — ABNORMAL HIGH (ref 70–99)
Potassium: 2.8 mmol/L — ABNORMAL LOW (ref 3.5–5.1)
Sodium: 139 mmol/L (ref 135–145)

## 2020-03-19 NOTE — ED Triage Notes (Signed)
Pt reports dizziness, sob since yesterday.  Pt fell today.  Hematoma to back of head.  States loc today.  Recent falls.  Family with pt.  Pt alert  Speech clear.  Denies chest pain.

## 2020-03-20 ENCOUNTER — Observation Stay
Admit: 2020-03-20 | Discharge: 2020-03-20 | Disposition: A | Discharge status: Home / Self Care | Payer: Medicare HMO | Attending: Internal Medicine | Admitting: Internal Medicine

## 2020-03-20 ENCOUNTER — Inpatient Hospital Stay
Admission: EM | Admit: 2020-03-20 | Discharge: 2020-03-21 | DRG: 312 | Disposition: A | Discharge status: Home Health | Payer: Medicare HMO | Attending: Internal Medicine | Admitting: Internal Medicine

## 2020-03-20 ENCOUNTER — Other Ambulatory Visit: Payer: Self-pay

## 2020-03-20 DIAGNOSIS — Z888 Allergy status to other drugs, medicaments and biological substances status: Secondary | ICD-10-CM | POA: Diagnosis not present

## 2020-03-20 DIAGNOSIS — E876 Hypokalemia: Secondary | ICD-10-CM | POA: Diagnosis present

## 2020-03-20 DIAGNOSIS — M1990 Unspecified osteoarthritis, unspecified site: Secondary | ICD-10-CM | POA: Diagnosis present

## 2020-03-20 DIAGNOSIS — F0390 Unspecified dementia without behavioral disturbance: Secondary | ICD-10-CM

## 2020-03-20 DIAGNOSIS — R55 Syncope and collapse: Secondary | ICD-10-CM

## 2020-03-20 DIAGNOSIS — I10 Essential (primary) hypertension: Secondary | ICD-10-CM | POA: Diagnosis not present

## 2020-03-20 DIAGNOSIS — Z79891 Long term (current) use of opiate analgesic: Secondary | ICD-10-CM | POA: Diagnosis not present

## 2020-03-20 DIAGNOSIS — R001 Bradycardia, unspecified: Secondary | ICD-10-CM | POA: Diagnosis present

## 2020-03-20 DIAGNOSIS — E860 Dehydration: Secondary | ICD-10-CM | POA: Diagnosis present

## 2020-03-20 DIAGNOSIS — E785 Hyperlipidemia, unspecified: Secondary | ICD-10-CM | POA: Diagnosis present

## 2020-03-20 DIAGNOSIS — K219 Gastro-esophageal reflux disease without esophagitis: Secondary | ICD-10-CM | POA: Diagnosis present

## 2020-03-20 DIAGNOSIS — N179 Acute kidney failure, unspecified: Secondary | ICD-10-CM | POA: Diagnosis present

## 2020-03-20 DIAGNOSIS — W1839XA Other fall on same level, initial encounter: Secondary | ICD-10-CM | POA: Diagnosis present

## 2020-03-20 DIAGNOSIS — Y929 Unspecified place or not applicable: Secondary | ICD-10-CM | POA: Diagnosis not present

## 2020-03-20 DIAGNOSIS — J45909 Unspecified asthma, uncomplicated: Secondary | ICD-10-CM | POA: Diagnosis present

## 2020-03-20 DIAGNOSIS — Z87891 Personal history of nicotine dependence: Secondary | ICD-10-CM | POA: Diagnosis not present

## 2020-03-20 DIAGNOSIS — F419 Anxiety disorder, unspecified: Secondary | ICD-10-CM | POA: Diagnosis present

## 2020-03-20 DIAGNOSIS — Y998 Other external cause status: Secondary | ICD-10-CM | POA: Diagnosis not present

## 2020-03-20 DIAGNOSIS — Z79899 Other long term (current) drug therapy: Secondary | ICD-10-CM | POA: Diagnosis not present

## 2020-03-20 DIAGNOSIS — Z9841 Cataract extraction status, right eye: Secondary | ICD-10-CM | POA: Diagnosis not present

## 2020-03-20 DIAGNOSIS — Z20822 Contact with and (suspected) exposure to covid-19: Secondary | ICD-10-CM | POA: Diagnosis present

## 2020-03-20 DIAGNOSIS — N189 Chronic kidney disease, unspecified: Secondary | ICD-10-CM | POA: Diagnosis present

## 2020-03-20 DIAGNOSIS — Z7983 Long term (current) use of bisphosphonates: Secondary | ICD-10-CM | POA: Diagnosis not present

## 2020-03-20 DIAGNOSIS — Z9842 Cataract extraction status, left eye: Secondary | ICD-10-CM | POA: Diagnosis not present

## 2020-03-20 DIAGNOSIS — S0003XA Contusion of scalp, initial encounter: Secondary | ICD-10-CM | POA: Diagnosis present

## 2020-03-20 DIAGNOSIS — Z9181 History of falling: Secondary | ICD-10-CM | POA: Diagnosis not present

## 2020-03-20 DIAGNOSIS — Z66 Do not resuscitate: Secondary | ICD-10-CM | POA: Diagnosis present

## 2020-03-20 DIAGNOSIS — I129 Hypertensive chronic kidney disease with stage 1 through stage 4 chronic kidney disease, or unspecified chronic kidney disease: Secondary | ICD-10-CM | POA: Diagnosis present

## 2020-03-20 LAB — ECHOCARDIOGRAM COMPLETE
AR max vel: 2.14 cm2
AV Area VTI: 2.21 cm2
AV Area mean vel: 2.09 cm2
AV Mean grad: 7 mmHg
AV Peak grad: 14.3 mmHg
Ao pk vel: 1.89 m/s
Area-P 1/2: 3.12 cm2
S' Lateral: 2.73 cm

## 2020-03-20 LAB — URINALYSIS, COMPLETE (UACMP) WITH MICROSCOPIC
Bacteria, UA: NONE SEEN
Bilirubin Urine: NEGATIVE
Glucose, UA: NEGATIVE mg/dL
Ketones, ur: NEGATIVE mg/dL
Leukocytes,Ua: NEGATIVE
Nitrite: NEGATIVE
Protein, ur: >=300 mg/dL — AB
Specific Gravity, Urine: 1.003 — ABNORMAL LOW (ref 1.005–1.030)
Squamous Epithelial / HPF: NONE SEEN (ref 0–5)
pH: 8 (ref 5.0–8.0)

## 2020-03-20 LAB — SARS CORONAVIRUS 2 BY RT PCR (HOSPITAL ORDER, PERFORMED IN ~~LOC~~ HOSPITAL LAB): SARS Coronavirus 2: NEGATIVE

## 2020-03-20 LAB — MAGNESIUM: Magnesium: 1.8 mg/dL (ref 1.7–2.4)

## 2020-03-20 LAB — TSH: TSH: 4.994 u[IU]/mL — ABNORMAL HIGH (ref 0.350–4.500)

## 2020-03-20 LAB — POTASSIUM: Potassium: 3 mmol/L — ABNORMAL LOW (ref 3.5–5.1)

## 2020-03-20 MED ORDER — ONDANSETRON HCL 4 MG PO TABS: 4.0000 mg | ORAL_TABLET | Freq: Four times a day (QID) | ORAL | Status: DC | PRN

## 2020-03-20 MED ORDER — ONDANSETRON HCL 4 MG/2ML IJ SOLN: 4.0000 mg | Freq: Four times a day (QID) | INTRAMUSCULAR | Status: DC | PRN

## 2020-03-20 MED ORDER — POLYETHYLENE GLYCOL 3350 17 G PO PACK: 17.0000 g | PACK | Freq: Every day | ORAL | Status: DC | PRN

## 2020-03-20 MED ORDER — HYDRALAZINE HCL 10 MG PO TABS
10.0000 mg | ORAL_TABLET | Freq: Once | ORAL | Status: AC
  Administered 2020-03-20: 10 mg via ORAL
  Filled 2020-03-20: qty 1

## 2020-03-20 MED ORDER — HYDRALAZINE HCL 50 MG PO TABS: 25.0000 mg | ORAL_TABLET | Freq: Three times a day (TID) | ORAL | Status: DC

## 2020-03-20 MED ORDER — HYDRALAZINE HCL 10 MG PO TABS
10.0000 mg | ORAL_TABLET | ORAL | Status: AC
  Administered 2020-03-20: 10 mg via ORAL
  Filled 2020-03-20: qty 1

## 2020-03-20 MED ORDER — KCL-LACTATED RINGERS-D5W 20 MEQ/L IV SOLN
INTRAVENOUS | Status: DC
  Filled 2020-03-20 (×13): qty 1000

## 2020-03-20 MED ORDER — PANTOPRAZOLE SODIUM 40 MG PO TBEC
40.0000 mg | DELAYED_RELEASE_TABLET | Freq: Every day | ORAL | Status: DC
  Administered 2020-03-20 – 2020-03-21 (×2): 40 mg via ORAL
  Filled 2020-03-20 (×2): qty 1

## 2020-03-20 MED ORDER — POTASSIUM CHLORIDE CRYS ER 20 MEQ PO TBCR
40.0000 meq | EXTENDED_RELEASE_TABLET | Freq: Once | ORAL | Status: AC
  Administered 2020-03-20: 40 meq via ORAL
  Filled 2020-03-20: qty 2

## 2020-03-20 MED ORDER — MAGNESIUM SULFATE IN D5W 1-5 GM/100ML-% IV SOLN
1.0000 g | Freq: Once | INTRAVENOUS | Status: AC
  Administered 2020-03-20: 1 g via INTRAVENOUS
  Filled 2020-03-20 (×2): qty 100

## 2020-03-20 MED ORDER — TRAMADOL HCL 50 MG PO TABS: 50.0000 mg | ORAL_TABLET | Freq: Four times a day (QID) | ORAL | Status: DC | PRN

## 2020-03-20 MED ORDER — AMLODIPINE BESYLATE 10 MG PO TABS
10.0000 mg | ORAL_TABLET | Freq: Every day | ORAL | Status: DC
  Administered 2020-03-20 – 2020-03-21 (×2): 10 mg via ORAL
  Filled 2020-03-20: qty 2
  Filled 2020-03-20: qty 1

## 2020-03-20 MED ORDER — ACETAMINOPHEN 650 MG RE SUPP: 650.0000 mg | Freq: Four times a day (QID) | RECTAL | Status: DC | PRN

## 2020-03-20 MED ORDER — HYDRALAZINE HCL 20 MG/ML IJ SOLN
10.0000 mg | Freq: Four times a day (QID) | INTRAMUSCULAR | Status: DC | PRN
  Administered 2020-03-20 – 2020-03-21 (×2): 10 mg via INTRAVENOUS
  Filled 2020-03-20 (×2): qty 1

## 2020-03-20 MED ORDER — POTASSIUM CHLORIDE CRYS ER 20 MEQ PO TBCR
40.0000 meq | EXTENDED_RELEASE_TABLET | ORAL | Status: AC
  Administered 2020-03-20 – 2020-03-21 (×2): 40 meq via ORAL
  Filled 2020-03-20 (×2): qty 2

## 2020-03-20 MED ORDER — KCL-LACTATED RINGERS-D5W 20 MEQ/L IV SOLN
Freq: Once | INTRAVENOUS | Status: AC
  Filled 2020-03-20: qty 1000

## 2020-03-20 MED ORDER — ATORVASTATIN CALCIUM 10 MG PO TABS
10.0000 mg | ORAL_TABLET | Freq: Every day | ORAL | Status: DC
  Administered 2020-03-20 – 2020-03-21 (×2): 10 mg via ORAL
  Filled 2020-03-20 (×2): qty 1

## 2020-03-20 MED ORDER — DONEPEZIL HCL 5 MG PO TABS
10.0000 mg | ORAL_TABLET | Freq: Every day | ORAL | Status: DC
  Administered 2020-03-20: 10 mg via ORAL
  Filled 2020-03-20: qty 2

## 2020-03-20 MED ORDER — POTASSIUM CHLORIDE 10 MEQ/100ML IV SOLN
10.0000 meq | INTRAVENOUS | Status: AC
  Administered 2020-03-20 (×2): 10 meq via INTRAVENOUS
  Filled 2020-03-20 (×2): qty 100

## 2020-03-20 MED ORDER — VITAMIN D (ERGOCALCIFEROL) 1.25 MG (50000 UNIT) PO CAPS
50000.0000 [IU] | ORAL_CAPSULE | ORAL | Status: DC
  Administered 2020-03-20: 50000 [IU] via ORAL
  Filled 2020-03-20: qty 1

## 2020-03-20 MED ORDER — SODIUM CHLORIDE 0.9 % IV SOLN: Freq: Once | INTRAVENOUS | Status: AC

## 2020-03-20 MED ORDER — ACETAMINOPHEN 325 MG PO TABS
650.0000 mg | ORAL_TABLET | Freq: Four times a day (QID) | ORAL | Status: DC | PRN
  Administered 2020-03-21: 650 mg via ORAL
  Filled 2020-03-20: qty 2

## 2020-03-20 NOTE — Consult Note (Signed)
PHARMACY CONSULT NOTE  Pharmacy Consult for Electrolyte Monitoring and Replacement   Recent Labs: Potassium (mmol/L)  Date Value  03/19/2020 2.8 (L)   Magnesium (mg/dL)  Date Value  50/09/7046 1.8   Calcium (mg/dL)  Date Value  88/91/6945 8.6 (L)   Sodium (mmol/L)  Date Value  03/19/2020 139   Assessment: 74 year old female with PMH of dementia, arthritis, asthma, HTN, GERD, and sleep apnea presenting to ED with syncopal episode, bradycardia, and hypokalemia.   Goal of Therapy:  Electrolytes WNL  Plan:  - MD replaced K with KCl 10 mEq IV x 2 doses + KCl 40 mEq PO x 1 dose  - Magnesium sulfate 1 gm IV x1  -f/u K at 1800 - f/u electrolytes with AM labs   Bari Mantis PharmD Clinical Pharmacist 03/20/2020

## 2020-03-20 NOTE — Progress Notes (Signed)
PT Cancellation Note  Patient Details Name: Julie Davis MRN: [id number] DOB: 16-Jan-1946   Cancelled Treatment:    Reason Eval/Treat Not Completed: Other (comment);Medical issues which prohibited therapy. Upon PT entering room, pt receiving lunch tray. BP also assessed, initially 201/81, and then reassessed at 192/84. PT evaluation held at this time due to elevated BP, to re-attempt as able. HR in 60s throughout Pt and PT conversation. Pt assisted with lunch set up.   Olga Coaster PT, DPT 2:23 PM,03/20/20

## 2020-03-20 NOTE — ED Notes (Signed)
Provider notified that pts heart rate is still in the high 40s and BP is still elevated.

## 2020-03-20 NOTE — Consult Note (Signed)
[facility] Cardiology Consultation Note  Patient ID: Julie Davis, MRN: [id number], DOB/AGE: 75/31/1947 75 y.o. Admit date: 03/20/2020   Date of Consult: 03/20/2020 Primary Physician: Danella Penton, MD Primary Cardiologist: none  Chief Complaint:  Chief Complaint  Patient presents with  . Dizziness  . Shortness of Breath  . Fall   Reason for Consult: Syncope    HPI: 74 y.o. female with hypertension hyperlipidemia who has been doing well on medication management but has had some bradycardia in recent weeks.  With this the patient has been very weak and fatigue and unable to do the physical activity.  She has had some significant amount of dizziness as well.  With this the patient had an episode of syncope where she passed out completely with no specific warning and almost hurt herself.  With this the patient almost had a seizure as well and the family was concerned.  When arrival to the emergency room the patient had an EKG showing normal sinus rhythm with heart rate of 47 bpm and occasional preventricular contractions otherwise no other EKG changes.  She did have a normal chest x-ray glomerular filtration rate of 45 and a troponin of 29.  Since then she has been resting comfortably with no evidence of chest pain and/or congestive heart failure symptoms.  The likelihood is that she has had changes consistent with medication management  Past Medical History:  Diagnosis Date  . Abdominal pain, LLQ   . Abdominal pain, RUQ   . Arthritis   . Asthma   . C. difficile colitis   . Diverticulitis   . GERD (gastroesophageal reflux disease)   . Hypertension   . IBS (irritable bowel syndrome)   . Shingles   . Sleep apnea       Surgical History:  Past Surgical History:  Procedure Laterality Date  . BACK SURGERY     laminectomy posterior lumbar facetectomy  . CATARACT EXTRACTION, BILATERAL    . CHOLECYSTECTOMY    . COLONOSCOPY  10/2013  . COLONOSCOPY WITH PROPOFOL N/A 06/25/2018    Procedure: COLONOSCOPY WITH PROPOFOL;  Surgeon: Scot Jun, MD;  Location: [facility] ENDOSCOPY;  Service: Endoscopy;  Laterality: N/A;  . ESOPHAGOGASTRODUODENOSCOPY (EGD) WITH PROPOFOL N/A 06/25/2018   Procedure: ESOPHAGOGASTRODUODENOSCOPY (EGD) WITH PROPOFOL;  Surgeon: Scot Jun, MD;  Location: [facility] ENDOSCOPY;  Service: Endoscopy;  Laterality: N/A;  . EYE SURGERY       Home Meds: Prior to Admission medications   Medication Sig Start Date End Date Taking? Authorizing Provider  alendronate (FOSAMAX) 70 MG tablet Take 70 mg by mouth once a week. Take with a full glass of water on an empty stomach.    [provider]  ALPRAZolam Prudy Feeler) 0.5 MG tablet Take 0.5 mg by mouth 2 (two) times daily as needed. 03/02/20   [provider]  atorvastatin (LIPITOR) 10 MG tablet Take 10 mg by mouth daily.    [provider]  donepezil (ARICEPT) 10 MG tablet Take 10 mg by mouth daily. 01/15/20   [provider]  metoprolol tartrate (LOPRESSOR) 25 MG tablet Take 25 mg by mouth 2 (two) times daily.    [provider]  pantoprazole (PROTONIX) 40 MG tablet Take 40 mg by mouth daily.    [provider]  traMADol (ULTRAM) 50 MG tablet Take by mouth every 6 (six) hours as needed.    [provider]  Vitamin D, Ergocalciferol, (DRISDOL) 1.25 MG (50000 UNIT) CAPS capsule Take 50,000 Units by  mouth once a week. 01/26/20   [provider]    Inpatient Medications:  . atorvastatin  10 mg Oral Daily  . donepezil  10 mg Oral Daily  . pantoprazole  40 mg Oral Daily  . Vitamin D (Ergocalciferol)  50,000 Units Oral Weekly   . sodium chloride      Allergies:  Allergies  Allergen Reactions  . Methotrexate Derivatives Diarrhea    Social History   Socioeconomic History  . Marital status: Married    Spouse name: Not on file  . Number of children: Not on file  . Years of education: Not on file  . Highest education level: Not on file   Occupational History  . Not on file  Tobacco Use  . Smoking status: Former Games developer  . Smokeless tobacco: Never Used  Vaping Use  . Vaping Use: Never used  Substance and Sexual Activity  . Alcohol use: Never  . Drug use: Never  . Sexual activity: Not on file  Other Topics Concern  . Not on file  Social History Narrative  . Not on file   Social Determinants of Health   Financial Resource Strain:   . Difficulty of Paying Living Expenses: Not on file  Food Insecurity:   . Worried About Programme researcher, broadcasting/film/video in the Last Year: Not on file  . Ran Out of Food in the Last Year: Not on file  Transportation Needs:   . Lack of Transportation (Medical): Not on file  . Lack of Transportation (Non-Medical): Not on file  Physical Activity:   . Days of Exercise per Week: Not on file  . Minutes of Exercise per Session: Not on file  Stress:   . Feeling of Stress : Not on file  Social Connections:   . Frequency of Communication with Friends and Family: Not on file  . Frequency of Social Gatherings with Friends and Family: Not on file  . Attends Religious Services: Not on file  . Active Member of Clubs or Organizations: Not on file  . Attends Banker Meetings: Not on file  . Marital Status: Not on file  Intimate Partner Violence:   . Fear of Current or Ex-Partner: Not on file  . Emotionally Abused: Not on file  . Physically Abused: Not on file  . Sexually Abused: Not on file     Family History  Problem Relation Age of Onset  . Breast cancer Father 63     Review of Systems Positive for syncope Negative for: General:  chills, fever, night sweats or weight changes.  Cardiovascular: PND orthopnea positive for syncope dizziness  Dermatological skin lesions rashes Respiratory: Cough congestion Urologic: Frequent urination urination at night and hematuria Abdominal: negative for nausea, vomiting, diarrhea, bright red blood per rectum, melena, or hematemesis Neurologic:  negative for visual changes, and/or hearing changes  All other systems reviewed and are otherwise negative except as noted above.  Labs: No results for input(s): CKTOTAL, CKMB, TROPONINI in the last 72 hours. Lab Results  Component Value Date   WBC 8.3 03/19/2020   HGB 14.2 03/19/2020   HCT 41.2 03/19/2020   MCV 89.0 03/19/2020   PLT 199 03/19/2020    Recent Labs  Lab 03/19/20 2012  NA 139  K 2.8*  CL 98  CO2 29  BUN 16  CREATININE 1.34*  CALCIUM 8.6*  GLUCOSE 130*   No results found for: CHOL, HDL, LDLCALC, TRIG No results found for: DDIMER  Radiology/Studies:  DG Chest 2  View  Result Date: 03/19/2020 CLINICAL DATA:  Shortness of breath EXAM: CHEST - 2 VIEW COMPARISON:  None. FINDINGS: Cardiomegaly. Hyperinflation of the lungs. Increased markings in the lung bases. No effusions. No acute bony abnormality. IMPRESSION: Cardiomegaly, hyperinflation.  Bibasilar atelectasis or infiltrates. Electronically Signed   By: Charlett Nose M.D.   On: 03/19/2020 21:02   CT Head Wo Contrast  Result Date: 03/19/2020 CLINICAL DATA:  Nonspecific dizziness. Fall today. Hematoma to back of head. Patient reports loss of consciousness. EXAM: CT HEAD WITHOUT CONTRAST TECHNIQUE: Contiguous axial images were obtained from the base of the skull through the vertex without intravenous contrast. COMPARISON:  None. FINDINGS: Brain: Moderate generalized atrophy and chronic small vessel ischemia. No intracranial hemorrhage, mass effect, or midline shift. No hydrocephalus. The basilar cisterns are patent. No evidence of territorial infarct or acute ischemia. No extra-axial or intracranial fluid collection. Vascular: Atherosclerosis of skullbase vasculature without hyperdense vessel or abnormal calcification. Skull: No fracture or focal lesion. Sinuses/Orbits: Paranasal sinuses and mastoid air cells are clear. The visualized orbits are unremarkable. Other: Left parietal scalp hematoma. IMPRESSION: 1. Left parietal  scalp hematoma. No acute intracranial abnormality. No skull fracture. 2. Moderate atrophy and chronic small vessel ischemia. Electronically Signed   By: Narda Rutherford M.D.   On: 03/19/2020 20:41   CT Cervical Spine Wo Contrast  Result Date: 03/19/2020 CLINICAL DATA:  Nonspecific dizziness. Fall today. Hematoma to back of head. Patient reports loss of consciousness. EXAM: CT CERVICAL SPINE WITHOUT CONTRAST TECHNIQUE: Multidetector CT imaging of the cervical spine was performed without intravenous contrast. Multiplanar CT image reconstructions were also generated. COMPARISON:  None. FINDINGS: Alignment: Straightening of normal lordosis. 3 mm anterolisthesis of C4 on C5 is likely degenerative and facet mediated. Trace anterolisthesis of T1 on T2 also likely degenerative. No traumatic subluxation. Skull base and vertebrae: No acute fracture. Vertebral body heights are maintained. The dens and skull base are intact. Incidental non fusion posterior arch of C1. Soft tissues and spinal canal: No prevertebral fluid or swelling. No visible canal hematoma. Disc levels: Disc space narrowing and endplate spurring at multiple levels from C3-C4 through C7-T1. Posterior disc osteophyte complex at C5-C6 causes narrowing of the bony canal. Scattered multilevel facet hypertrophy and neural foraminal stenosis. Upper chest: No acute findings. Other: Carotid calcifications. IMPRESSION: Multilevel degenerative change throughout the cervical spine without acute fracture or subluxation. Electronically Signed   By: Narda Rutherford M.D.   On: 03/19/2020 20:45    EKG: Sinus bradycardia at 47 bpm otherwise normal  Weights: [employer]   03/19/20 2010  Weight: 74.8 kg     Physical Exam: Blood pressure 106/84, pulse (!) 53, temperature 98.7 F (37.1 C), temperature source Oral, resp. rate 15, height  (1.6 m), weight 74.8 kg, SpO2 98 %. Body mass index is 29.23 kg/m. General: Well developed, well nourished, in no  acute distress. Head eyes ears nose throat: Normocephalic, atraumatic, sclera non-icteric, no xanthomas, nares are without discharge. No apparent thyromegaly and/or mass  Lungs: Normal respiratory effort.  no wheezes, no rales, no rhonchi.  Heart: RRR with normal S1 S2. no murmur gallop, no rub, PMI is normal size and placement, carotid upstroke normal without bruit, jugular venous pressure is normal Abdomen: Soft, non-tender, non-distended with normoactive bowel sounds. No hepatomegaly. No rebound/guarding. No obvious abdominal masses. Abdominal aorta is normal size without bruit Extremities: No edema. no cyanosis, no clubbing, no ulcers  Peripheral : 2+ bilateral upper extremity pulses, 2+ bilateral femoral pulses, 2+ bilateral  dorsal pedal pulse Neuro: Alert and oriented. No facial asymmetry. No focal deficit. Moves all extremities spontaneously. Musculoskeletal: Normal muscle tone without kyphosis Psych:  Responds to questions appropriately with a normal affect.    Assessment: 74 year old female with hypertension hyperlipidemia chronic kidney disease and episode of syncope most consistent with use of metoprolol and other blood pressure medication management without evidence of myocardial infarction acute coronary syndrome or congestive heart failure  Plan: 1.  Continue watching telemetry and serial ECG and enzymes to assess for myocardial infarction 2.  Consider echocardiogram for further evaluation and treatment options as cause of syncope 3.  Abstain from metoprolol likely causing episode of syncope and bradycardia 4.  Observation and further treatment thereof as necessary after above  Signed, Lamar Blinks M.D. [facility] Cardiology 03/20/2020, 8:38 AM

## 2020-03-20 NOTE — ED Notes (Signed)
Pt still awaiting transport; Melody sec reapplied request - last one canceled for reason unknown

## 2020-03-20 NOTE — ED Notes (Signed)
Pt in bed on cardiac, bp and pulse ox monitor. Provider at bedside. Aware of current BP. Pt states she fell and hit back of head. Family states pt had LOC. Pt noted to have hematoma to posterior head. No active exterior bleeding noted. Pt noted to have a bruise below the right eye. Family states that is from a previous fall. Family states pt fell 3 times in the last week or so.

## 2020-03-20 NOTE — Progress Notes (Signed)
*  PRELIMINARY RESULTS* Echocardiogram 2D Echocardiogram has been performed.  Cristela Blue 03/20/2020, 11:48 AM

## 2020-03-20 NOTE — ED Notes (Signed)
Verified with pharmacy that the of IV potassium and the d5LR with of KCl can all go in. Pharmacy stated yes and the d5LR with of KCl can go in as a bolus over 1 hour

## 2020-03-20 NOTE — ED Provider Notes (Signed)
[facility] Emergency Department Provider Note  ____________________________________________  Time seen: Approximately 2:42 AM  I have reviewed the triage vital signs and the nursing notes.   HISTORY  Chief Complaint Dizziness, Shortness of Breath, and Fall    HPI Julie Davis is a 74 y.o. female with a history of GERD, hypertension who comes the ED due to syncope.  She reports 3 episodes of lightheadedness  and passing out over the last week.  Each time it occurs while she is standing upright.  Denies chest pain or abdominal pain, no headache vision changes paresthesias or motor weakness.  She reports eating normally, no vomiting or diarrhea.  No fevers chills or body aches, no sick contacts.  She has had both Covid vaccines.  Not no aggravating or alleviating factors.  Currently feels fine.  During recent fall she hit the back of her head as well as her right cheek.  She is on antihypertensives which she is compliant with.  She reports that her doctor, Bethann Punches recently had to decrease her blood pressure medicine.  Denies shortness of breath or cough.   Past Medical History:  Diagnosis Date  . Abdominal pain, LLQ   . Abdominal pain, RUQ   . Arthritis   . Asthma   . C. difficile colitis   . Diverticulitis   . GERD (gastroesophageal reflux disease)   . Hypertension   . IBS (irritable bowel syndrome)   . Shingles   . Sleep apnea      There are no problems to display for this patient.    Past Surgical History:  Procedure Laterality Date  . BACK SURGERY     laminectomy posterior lumbar facetectomy  . CATARACT EXTRACTION, BILATERAL    . CHOLECYSTECTOMY    . COLONOSCOPY  10/2013  . COLONOSCOPY WITH PROPOFOL N/A 06/25/2018   Procedure: COLONOSCOPY WITH PROPOFOL;  Surgeon: Scot Jun, MD;  Location: [facility] ENDOSCOPY;  Service: Endoscopy;  Laterality: N/A;  . ESOPHAGOGASTRODUODENOSCOPY (EGD) WITH PROPOFOL N/A 06/25/2018   Procedure:  ESOPHAGOGASTRODUODENOSCOPY (EGD) WITH PROPOFOL;  Surgeon: Scot Jun, MD;  Location: [facility] ENDOSCOPY;  Service: Endoscopy;  Laterality: N/A;  . EYE SURGERY       Prior to Admission medications   Medication Sig Start Date End Date Taking? Authorizing Provider  alendronate (FOSAMAX) 70 MG tablet Take 70 mg by mouth once a week. Take with a full glass of water on an empty stomach.    [provider]  ALPRAZolam Prudy Feeler) 0.25 MG tablet Take 0.25 mg by mouth at bedtime as needed for anxiety.    [provider]  atorvastatin (LIPITOR) 10 MG tablet Take 10 mg by mouth daily.    [provider]  donepezil (ARICEPT) 5 MG tablet Take 5 mg by mouth at bedtime.    [provider]  lisinopril-hydrochlorothiazide (PRINZIDE,ZESTORETIC) 20-12.5 MG tablet Take 1 tablet by mouth daily.    [provider]  meloxicam (MOBIC) 15 MG tablet Take 15 mg by mouth daily.    [provider]  methocarbamol (ROBAXIN) 500 MG tablet Take 500 mg by mouth 4 (four) times daily.    [provider]  metoprolol succinate (TOPROL-XL) 50 MG 24 hr tablet Take 50 mg by mouth daily. Take with or immediately following a meal.    [provider]  metoprolol tartrate (LOPRESSOR) 25 MG tablet Take 25 mg by mouth 2 (two) times daily.    [provider]  pantoprazole (PROTONIX) 40 MG tablet Take  40 mg by mouth daily.    [provider]  traMADol (ULTRAM) 50 MG tablet Take by mouth every 6 (six) hours as needed.    [provider]     Allergies Methotrexate derivatives   Family History  Problem Relation Age of Onset  . Breast cancer Father 27    Social History Social History   Tobacco Use  . Smoking status: Former Games developer  . Smokeless tobacco: Never Used  Vaping Use  . Vaping Use: Never used  Substance Use Topics  . Alcohol use: Never  . Drug use: Never    Review of Systems  Constitutional:   No fever or chills.  ENT:    No sore throat. No rhinorrhea. Cardiovascular:   No chest pain or syncope. Respiratory:   No dyspnea or cough. Gastrointestinal:   Negative for abdominal pain, vomiting and diarrhea.  Musculoskeletal:   Negative for focal pain or swelling All other systems reviewed and are negative except as documented above in ROS and HPI.  ____________________________________________   PHYSICAL EXAM:  VITAL SIGNS: ED Triage Vitals  Enc Vitals Group     BP 03/19/20 2012 (!) 224/61     Pulse Rate 03/19/20 2012 (!) 45     Resp 03/19/20 2012 20     Temp 03/19/20 2012 98.7 F (37.1 C)     Temp Source 03/19/20 2012 Oral     SpO2 03/19/20 2012 97 %     Weight 03/19/20 2010 165 lb (74.8 kg)     Height 03/19/20 2010 5\' 3"  (1.6 m)     Head Circumference --      Peak Flow --      Pain Score 03/19/20 2009 5     Pain Loc --      Pain Edu? --      Excl. in GC? --     Vital signs reviewed, nursing assessments reviewed.   Constitutional:   Alert and oriented. Non-toxic appearance. Eyes:   Conjunctivae are normal. EOMI. PERRL. ENT      Head:   Normocephalic with small amount of bruising over the right maxilla.  No bony point tenderness depression or crepitus.  No battle sign, no raccoon eyes, no otorrhea or epistaxis or rhinorrhea      Nose:   Normal      Mouth/Throat:   Normal, no dental injury or tongue laceration.      Neck:   No meningismus. Full ROM.  No midline spinal tenderness Hematological/Lymphatic/Immunilogical:   No cervical lymphadenopathy. Cardiovascular:   Bradycardia heart rate 50-60. Symmetric bilateral radial and DP pulses.  No murmurs. Cap refill less than 2 seconds. Respiratory:   Normal respiratory effort without tachypnea/retractions. Breath sounds are clear and equal bilaterally. No wheezes/rales/rhonchi. Gastrointestinal:   Soft and nontender. Non distended. There is no CVA tenderness.  No rebound, rigidity, or guarding. Musculoskeletal:   Normal range of motion in all  extremities. No joint effusions.  No lower extremity tenderness.  No edema. Neurologic:   Normal speech and language.  Motor grossly intact. No acute focal neurologic deficits are appreciated.  Skin:    Skin is warm, dry and intact. No rash noted.  No petechiae, purpura, or bullae.  ____________________________________________    LABS (pertinent positives/negatives) (all labs ordered are listed, but only abnormal results are displayed) Labs Reviewed  BASIC METABOLIC PANEL - Abnormal; Notable for the following components:      Result Value   Potassium 2.8 (*)    Glucose, Bld  130 (*)    Creatinine, Ser 1.34 (*)    Calcium 8.6 (*)    GFR calc non Af Amer 39 (*)    GFR calc Af Amer 45 (*)    All other components within normal limits  URINALYSIS, COMPLETE (UACMP) WITH MICROSCOPIC - Abnormal; Notable for the following components:   Color, Urine STRAW (*)    APPearance CLEAR (*)    Specific Gravity, Urine 1.003 (*)    Hgb urine dipstick SMALL (*)    Protein, ur >=300 (*)    All other components within normal limits  TROPONIN I (HIGH SENSITIVITY) - Abnormal; Notable for the following components:   Troponin I (High Sensitivity) 21 (*)    All other components within normal limits  TROPONIN I (HIGH SENSITIVITY) - Abnormal; Notable for the following components:   Troponin I (High Sensitivity) 29 (*)    All other components within normal limits  SARS CORONAVIRUS 2 BY RT PCR (HOSPITAL ORDER, PERFORMED IN [facility])  CBC  MAGNESIUM   ____________________________________________   EKG  Interpreted by me Sinus bradycardia rate of 58 with bigeminy, left axis, QTC of 614 ms.  Normal QRS ST segments and T waves with sinus beats.  PVC beats demonstrate repolarization abnormalities.  Prior EKG showed sinus bradycardia rate of 47, normal axis and intervals, poor R wave progression, normal ST segments and T waves.  ____________________________________________     RADIOLOGY  DG Chest 2 View  Result Date: 03/19/2020 CLINICAL DATA:  Shortness of breath EXAM: CHEST - 2 VIEW COMPARISON:  None. FINDINGS: Cardiomegaly. Hyperinflation of the lungs. Increased markings in the lung bases. No effusions. No acute bony abnormality. IMPRESSION: Cardiomegaly, hyperinflation.  Bibasilar atelectasis or infiltrates. Electronically Signed   By: Charlett Nose M.D.   On: 03/19/2020 21:02   CT Head Wo Contrast  Result Date: 03/19/2020 CLINICAL DATA:  Nonspecific dizziness. Fall today. Hematoma to back of head. Patient reports loss of consciousness. EXAM: CT HEAD WITHOUT CONTRAST TECHNIQUE: Contiguous axial images were obtained from the base of the skull through the vertex without intravenous contrast. COMPARISON:  None. FINDINGS: Brain: Moderate generalized atrophy and chronic small vessel ischemia. No intracranial hemorrhage, mass effect, or midline shift. No hydrocephalus. The basilar cisterns are patent. No evidence of territorial infarct or acute ischemia. No extra-axial or intracranial fluid collection. Vascular: Atherosclerosis of skullbase vasculature without hyperdense vessel or abnormal calcification. Skull: No fracture or focal lesion. Sinuses/Orbits: Paranasal sinuses and mastoid air cells are clear. The visualized orbits are unremarkable. Other: Left parietal scalp hematoma. IMPRESSION: 1. Left parietal scalp hematoma. No acute intracranial abnormality. No skull fracture. 2. Moderate atrophy and chronic small vessel ischemia. Electronically Signed   By: Narda Rutherford M.D.   On: 03/19/2020 20:41   CT Cervical Spine Wo Contrast  Result Date: 03/19/2020 CLINICAL DATA:  Nonspecific dizziness. Fall today. Hematoma to back of head. Patient reports loss of consciousness. EXAM: CT CERVICAL SPINE WITHOUT CONTRAST TECHNIQUE: Multidetector CT imaging of the cervical spine was performed without intravenous contrast. Multiplanar CT image reconstructions were also generated.  COMPARISON:  None. FINDINGS: Alignment: Straightening of normal lordosis. 3 mm anterolisthesis of C4 on C5 is likely degenerative and facet mediated. Trace anterolisthesis of T1 on T2 also likely degenerative. No traumatic subluxation. Skull base and vertebrae: No acute fracture. Vertebral body heights are maintained. The dens and skull base are intact. Incidental non fusion posterior arch of C1. Soft tissues and spinal canal: No prevertebral fluid or swelling. No visible  canal hematoma. Disc levels: Disc space narrowing and endplate spurring at multiple levels from C3-C4 through C7-T1. Posterior disc osteophyte complex at C5-C6 causes narrowing of the bony canal. Scattered multilevel facet hypertrophy and neural foraminal stenosis. Upper chest: No acute findings. Other: Carotid calcifications. IMPRESSION: Multilevel degenerative change throughout the cervical spine without acute fracture or subluxation. Electronically Signed   By: Narda Rutherford M.D.   On: 03/19/2020 20:45    ____________________________________________   PROCEDURES .1-3 Lead EKG Interpretation Performed by: Sharman Cheek, MD Authorized by: Sharman Cheek, MD     Interpretation: abnormal     ECG rate:  55   ECG rate assessment: bradycardic     Rhythm: sinus rhythm     Ectopy: none     Conduction: normal      ____________________________________________  DIFFERENTIAL DIAGNOSIS   Hypokalemia, hypomagnesemia, paroxysmal dysrhythmia, dehydration, intracranial hemorrhage, C-spine fracture.  CLINICAL IMPRESSION / ASSESSMENT AND PLAN / ED COURSE  Medications ordered in the ED: Medications  hydrALAZINE (APRESOLINE) tablet 10 mg (has no administration in time range)  potassium chloride 10 mEq in 100 mL IVPB (0 mEq Intravenous Stopped 03/20/20 0507)  potassium chloride SA (KLOR-CON) CR tablet 40 mEq (40 mEq Oral Given 03/20/20 0251)  hydrALAZINE (APRESOLINE) tablet 10 mg (10 mg Oral Given 03/20/20 0319)  dextrose 5% in  lactated ringers with KCl 20 mEq/L infusion ( Intravenous Stopped 03/20/20 0507)    Pertinent labs & imaging results that were available during my care of the patient were reviewed by me and considered in my medical decision making (see chart for details).  Julie Davis was evaluated in Emergency Department on 03/20/2020 for the symptoms described in the history of present illness. She was evaluated in the context of the global COVID-19 pandemic, which necessitated consideration that the patient might be at risk for infection with the SARS-CoV-2 virus that causes COVID-19. Institutional protocols and algorithms that pertain to the evaluation of patients at risk for COVID-19 are in a state of rapid change based on information released by regulatory bodies including the CDC and federal and state organizations. These policies and algorithms were followed during the patient's care in the ED.   Patient presents with dizziness and syncope, recurrent this week.  Vital signs are concerning for marked hypertension as well as bradycardia.  Mental status is currently normal, no significant symptoms currently.  Labs show potassium of 2.8 which may be causing cardiac rhythm instability.  Will check a magnesium level, give IV fluids and supplemental IV and p.o. potassium and reassess symptoms.  Trauma work-up was negative, CT head and cervical spine unremarkable.  Chest x-ray unremarkable.   ----------------------------------------- 5:30 AM on 03/20/2020 -----------------------------------------  After 40 mEq of potassium p.o. and 40 mEq of potassium IV, heart rate remains in the 40s, patient still feels weak.  Still remains severely hypertensive despite oral hydralazine (IV formulation is on critical shortage).  Due to bradycardia, beta-blockers and clonidine are not options.  Will need to hospitalize for symptomatic bradycardia, syncope, severe hypertension for medication adjustment.         ____________________________________________   FINAL CLINICAL IMPRESSION(S) / ED DIAGNOSES    Final diagnoses:  Symptomatic bradycardia  Severe hypertension  Syncope, unspecified syncope type  Hypokalemia     ED Discharge Orders    None      Portions of this note were generated with dragon dictation software. Dictation errors may occur despite best attempts at proofreading.   Sharman Cheek, MD 03/20/20 [phone]

## 2020-03-20 NOTE — Consult Note (Signed)
PHARMACY CONSULT NOTE  Pharmacy Consult for Electrolyte Monitoring and Replacement   Recent Labs: Potassium (mmol/L)  Date Value  03/20/2020 3.0 (L)   Magnesium (mg/dL)  Date Value  70/07/7492 1.8   Calcium (mg/dL)  Date Value  49/67/5916 8.6 (L)   Sodium (mmol/L)  Date Value  03/19/2020 139   Assessment: 74 year old female with PMH of dementia, arthritis, asthma, HTN, GERD, and sleep apnea presenting to ED with syncopal episode, bradycardia, and hypokalemia.   Goal of Therapy:  Electrolytes WNL  Plan:  Will give KCl 40 mEq x 2 PO.  - f/u electrolytes with AM labs   Paschal Dopp, PharmD Clinical Pharmacist 03/20/2020

## 2020-03-20 NOTE — ED Notes (Signed)
Patient is resting comfortably. 

## 2020-03-20 NOTE — TOC Initial Note (Signed)
Transition of Care [facility]) - Initial/Assessment Note    Patient Details  Name: Julie Davis MRN: [id number] Date of Birth: [birth year]  Transition of Care [facility]) CM/SW Contact:    [place] Cellar, RN Phone Number: 03/20/2020, 11:30 AM  Clinical Narrative:                 Spoke to patient and daughter at bedside. Daughter states she moved in with her parents prior to COVID due to increased needs from patient. Patient has early onset dementia and lives with her spouse who is having increased physical needs. Daughter provides all transportation as needed for appointments. Has completed all COVID vaccines. Patient has DME equipment from her mother who passed several years ago but states she does not typically use anything other than a rollator at this time.   Expected Discharge Plan: Home/Self Care Barriers to Discharge: Continued Medical Work up   Patient Goals and CMS Choice Patient states their goals for this hospitalization and ongoing recovery are:: Feel better and get home      Expected Discharge Plan and Services Expected Discharge Plan: Home/Self Care       Living arrangements for the past 2 months: Single Family Home                                      Prior Living Arrangements/Services Living arrangements for the past 2 months: Single Family Home Lives with:: Spouse, Adult Children Patient language and need for interpreter reviewed:: Yes Do you feel safe going back to the place where you live?: Yes      Need for Family Participation in Patient Care: Yes (Comment) Care giver support system in place?: Yes (comment) Current home services: DME (walker, BSC, Shower Chair) Criminal Activity/Legal Involvement Pertinent to Current Situation/Hospitalization: No - Comment as needed  Activities of Daily Living      Permission Sought/Granted Permission sought to share information with : Family Supports Permission granted to share information with : Yes, Verbal  Permission Granted  Share Information with NAME: Daughter           Emotional Assessment Appearance:: Appears stated age Attitude/Demeanor/Rapport: Engaged Affect (typically observed): Accepting, Adaptable Orientation: : Oriented to Place, Oriented to Self, Oriented to  Time, Oriented to Situation Alcohol / Substance Use: Not Applicable Psych Involvement: No (comment)  Admission diagnosis:  Symptomatic bradycardia [R00.1] Bradycardia [R00.1] Patient Active Problem List   Diagnosis Date Noted  . Symptomatic bradycardia 03/20/2020  . Bradycardia 03/20/2020  . Hypokalemia   . Severe hypertension   . Syncope    PCP:  Danella Penton, MD Pharmacy:   [facility] [address] [place] - [address] [place] [address] Phone: [phone] Fax: [phone]     Social Determinants of Health (SDOH) Interventions    Readmission Risk Interventions No flowsheet data found.

## 2020-03-20 NOTE — Plan of Care (Signed)

## 2020-03-20 NOTE — Consult Note (Signed)
PHARMACY CONSULT NOTE  Pharmacy Consult for Electrolyte Monitoring and Replacement   Recent Labs: Potassium (mmol/L)  Date Value  03/19/2020 2.8 (L)   Magnesium (mg/dL)  Date Value  01/77/9390 1.8   Calcium (mg/dL)  Date Value  30/03/2329 8.6 (L)   Sodium (mmol/L)  Date Value  03/19/2020 139   Assessment: 74 year old female with PMH of dementia, arthritis, asthma, HTN, GERD, and sleep apnea presenting to ED with syncopal episode, bradycardia, and hypokalemia.   Goal of Therapy:  Electrolytes WNL  Plan:  - MD replaced K with KCl 10 mEq IV x 2 doses + KCl 40 mEq PO x 1 dose  - f/u electrolytes with AM labs   Reatha Armour, PharmD Pharmacy Resident  03/20/2020 10:34 AM

## 2020-03-20 NOTE — Progress Notes (Addendum)
Pt BP 198/83. Steward Drone Morrison-NP made aware. New order received.

## 2020-03-20 NOTE — H&P (Signed)
[facility] [place] at [facility]   PATIENT NAME: Julie Davis    MR#:  [id number]  DATE OF BIRTH:  1946-01-26  DATE OF ADMISSION:  03/20/2020  PRIMARY CARE PHYSICIAN: Danella Penton, MD   REQUESTING/REFERRING PHYSICIAN: Dr. Scotty Court  Patient coming from : home lives with husband and daughter   CHIEF COMPLAINT:   Syncopal episode with multiple falls in the last few days HISTORY OF PRESENT ILLNESS:  Julie Davis  is a 74 y.o. female with a known history of dementia (informed by daughter), arthritis, asthma, hypertension, Genella Rife, sleep apnea comes to the emergency room accompanied by daughter after she had syncopal episode. ?LOC and fall. No fever chills no sick contact.  Patient has had both COVID vaccines. During recent falls she had back of her head as well as right cheek.  She is on antihypertensives and has been taking her medications appropriately. ED course: heart rate 39-55 elevated blood pressure with systolic in the 200s. Appears anxious with difficulty sleeping daughter in the ER patient denies chest pain however has some headache where she hit her head  Potassium 2.8 creatinine 1.3 UA negative for UTI  CT head1. Left parietal scalp hematoma. No acute intracranial abnormality. No skull fracture. 2. Moderate atrophy and chronic small vessel ischemia  CT cervical spine Multilevel degenerative change throughout the cervical spine without acute fracture or subluxation.  Patient is being admitted with syncopal episode, bradycardia, left parietal hematoma status post fall and hypokalemia PAST MEDICAL HISTORY:   Past Medical History:  Diagnosis Date  . Abdominal pain, LLQ   . Abdominal pain, RUQ   . Arthritis   . Asthma   . C. difficile colitis   . Diverticulitis   . GERD (gastroesophageal reflux disease)   . Hypertension   . IBS (irritable bowel syndrome)   . Shingles   . Sleep apnea     PAST SURGICAL HISTOIRY:   Past Surgical  History:  Procedure Laterality Date  . BACK SURGERY     laminectomy posterior lumbar facetectomy  . CATARACT EXTRACTION, BILATERAL    . CHOLECYSTECTOMY    . COLONOSCOPY  10/2013  . COLONOSCOPY WITH PROPOFOL N/A 06/25/2018   Procedure: COLONOSCOPY WITH PROPOFOL;  Surgeon: Scot Jun, MD;  Location: [facility] ENDOSCOPY;  Service: Endoscopy;  Laterality: N/A;  . ESOPHAGOGASTRODUODENOSCOPY (EGD) WITH PROPOFOL N/A 06/25/2018   Procedure: ESOPHAGOGASTRODUODENOSCOPY (EGD) WITH PROPOFOL;  Surgeon: Scot Jun, MD;  Location: [facility] ENDOSCOPY;  Service: Endoscopy;  Laterality: N/A;  . EYE SURGERY      SOCIAL HISTORY:   Social History   Tobacco Use  . Smoking status: Former Games developer  . Smokeless tobacco: Never Used  Substance Use Topics  . Alcohol use: Never    FAMILY HISTORY:   Family History  Problem Relation Age of Onset  . Breast cancer Father 61    DRUG ALLERGIES:   Allergies  Allergen Reactions  . Methotrexate Derivatives Diarrhea    REVIEW OF SYSTEMS:  Review of Systems  Constitutional: Negative for chills, fever and weight loss.  HENT: Negative for ear discharge, ear pain and nosebleeds.   Eyes: Negative for blurred vision, pain and discharge.  Respiratory: Negative for sputum production, shortness of breath, wheezing and stridor.   Cardiovascular: Negative for chest pain, palpitations, orthopnea and PND.  Gastrointestinal: Negative for abdominal pain, diarrhea, nausea and vomiting.  Genitourinary: Negative for frequency and urgency.  Musculoskeletal: Positive for back pain, falls and joint pain.  Neurological: Positive for dizziness, weakness  and headaches. Negative for sensory change, speech change and focal weakness.  Psychiatric/Behavioral: Negative for depression and hallucinations. The patient is nervous/anxious.      MEDICATIONS AT HOME:   Prior to Admission medications   Medication Sig Start Date End Date Taking? Authorizing Provider  alendronate  (FOSAMAX) 70 MG tablet Take 70 mg by mouth once a week. Take with a full glass of water on an empty stomach.    [provider]  ALPRAZolam Prudy Feeler) 0.5 MG tablet Take 0.5 mg by mouth 2 (two) times daily as needed. 03/02/20   [provider]  atorvastatin (LIPITOR) 10 MG tablet Take 10 mg by mouth daily.    [provider]  donepezil (ARICEPT) 10 MG tablet Take 10 mg by mouth daily. 01/15/20   [provider]  metoprolol tartrate (LOPRESSOR) 25 MG tablet Take 25 mg by mouth 2 (two) times daily.    [provider]  pantoprazole (PROTONIX) 40 MG tablet Take 40 mg by mouth daily.    [provider]  traMADol (ULTRAM) 50 MG tablet Take by mouth every 6 (six) hours as needed.    [provider]  Vitamin D, Ergocalciferol, (DRISDOL) 1.25 MG (50000 UNIT) CAPS capsule Take 50,000 Units by mouth once a week. 01/26/20   [provider]      VITAL SIGNS:  Blood pressure 106/84, pulse (!) 53, temperature 98.7 F (37.1 C), temperature source Oral, resp. rate 15, height 5\' 3"  (1.6 m), weight 74.8 kg, SpO2 98 %.  PHYSICAL EXAMINATION:  GENERAL:  74 y.o.-year-old patient lying in the bed with no acute distress.  EYES: Pupils equal, round, reactive to light and accommodation. No scleral icterus.  HEENT: Head atraumatic, normocephalic. Oropharynx and nasopharynx clear. Old right eye bruise. Left scalp tenderness NECK:  Supple, no jugular venous distention. No thyroid enlargement, no tenderness.  LUNGS: Normal breath sounds bilaterally, no wheezing, rales,rhonchi or crepitation. No use of accessory muscles of respiration.  CARDIOVASCULAR: S1, S2 normal. No murmurs, rubs, or gallops.  ABDOMEN: Soft, nontender, nondistended. Bowel sounds present. No organomegaly or mass.  EXTREMITIES: No pedal edema, cyanosis, or clubbing.  NEUROLOGIC: Cranial nerves II through XII are intact. Muscle strength 5/5 in all extremities. Sensation intact. Gait not  checked.  PSYCHIATRIC: The patient is alert and oriented x 3.  SKIN: No obvious rash, lesion, or ulcer.   LABORATORY PANEL:   CBC Recent Labs  Lab 03/19/20 2012  WBC 8.3  HGB 14.2  HCT 41.2  PLT 199   ------------------------------------------------------------------------------------------------------------------  Chemistries  Recent Labs  Lab 03/19/20 2012 03/20/20 0257  NA 139  --   K 2.8*  --   CL 98  --   CO2 29  --   GLUCOSE 130*  --   BUN 16  --   CREATININE 1.34*  --   CALCIUM 8.6*  --   MG  --  1.8   ------------------------------------------------------------------------------------------------------------------  Cardiac Enzymes No results for input(s): TROPONINI in the last 168 hours. ------------------------------------------------------------------------------------------------------------------  RADIOLOGY:  DG Chest 2 View  Result Date: 03/19/2020 CLINICAL DATA:  Shortness of breath EXAM: CHEST - 2 VIEW COMPARISON:  None. FINDINGS: Cardiomegaly. Hyperinflation of the lungs. Increased markings in the lung bases. No effusions. No acute bony abnormality. IMPRESSION: Cardiomegaly, hyperinflation.  Bibasilar atelectasis or infiltrates. Electronically Signed   By: 03/21/2020 M.D.   On: 03/19/2020 21:02   CT Head Wo Contrast  Result Date: 03/19/2020 CLINICAL DATA:  Nonspecific dizziness. Fall today. Hematoma to back  of head. Patient reports loss of consciousness. EXAM: CT HEAD WITHOUT CONTRAST TECHNIQUE: Contiguous axial images were obtained from the base of the skull through the vertex without intravenous contrast. COMPARISON:  None. FINDINGS: Brain: Moderate generalized atrophy and chronic small vessel ischemia. No intracranial hemorrhage, mass effect, or midline shift. No hydrocephalus. The basilar cisterns are patent. No evidence of territorial infarct or acute ischemia. No extra-axial or intracranial fluid collection. Vascular: Atherosclerosis of skullbase  vasculature without hyperdense vessel or abnormal calcification. Skull: No fracture or focal lesion. Sinuses/Orbits: Paranasal sinuses and mastoid air cells are clear. The visualized orbits are unremarkable. Other: Left parietal scalp hematoma. IMPRESSION: 1. Left parietal scalp hematoma. No acute intracranial abnormality. No skull fracture. 2. Moderate atrophy and chronic small vessel ischemia. Electronically Signed   By: Narda Rutherford M.D.   On: 03/19/2020 20:41   CT Cervical Spine Wo Contrast  Result Date: 03/19/2020 CLINICAL DATA:  Nonspecific dizziness. Fall today. Hematoma to back of head. Patient reports loss of consciousness. EXAM: CT CERVICAL SPINE WITHOUT CONTRAST TECHNIQUE: Multidetector CT imaging of the cervical spine was performed without intravenous contrast. Multiplanar CT image reconstructions were also generated. COMPARISON:  None. FINDINGS: Alignment: Straightening of normal lordosis. 3 mm anterolisthesis of C4 on C5 is likely degenerative and facet mediated. Trace anterolisthesis of T1 on T2 also likely degenerative. No traumatic subluxation. Skull base and vertebrae: No acute fracture. Vertebral body heights are maintained. The dens and skull base are intact. Incidental non fusion posterior arch of C1. Soft tissues and spinal canal: No prevertebral fluid or swelling. No visible canal hematoma. Disc levels: Disc space narrowing and endplate spurring at multiple levels from C3-C4 through C7-T1. Posterior disc osteophyte complex at C5-C6 causes narrowing of the bony canal. Scattered multilevel facet hypertrophy and neural foraminal stenosis. Upper chest: No acute findings. Other: Carotid calcifications. IMPRESSION: Multilevel degenerative change throughout the cervical spine without acute fracture or subluxation. Electronically Signed   By: Narda Rutherford M.D.   On: 03/19/2020 20:45    EKG:    IMPRESSION AND PLAN:   Julie Davis  is a 74 y.o. female with a known history of  dementia (informed by daughter), arthritis, asthma, hypertension, Genella Rife, sleep apnea comes to the emergency room accompanied by daughter after she had syncopal episode. ?LOC and fall. No fever chills no sick contact.  1. syncope with falls suspected due to bradycardia -admit for observation -hold beta-blockers -cardiology consultation with Dr. Gwen Pounds -PT OT -TOC for discharge planning -COVID negative -tele-monitor -echo of the heart  2. hypokalemia with acute renal failure/prerenal -replace potassium -IV fluids -follow metabolic panel  3. Left parietal scalp hematoma status post fall -avoid blood thinners including aspirin  4. Uncontrolled hypertension -PRN hydralazine -start amlodipine 10 mg Q day  5. Dementia with anxiety -continue Aricept -daughter tells me she takes Xanax .5 mg at bedtime   6. DVT prophylaxis SCD -no antiplatelet due to hematoma   Family Communication :daughter in the ER Consults : cardiology Dr. Gwen Pounds Code Status : DNR prior to admission-- discussed with daughter DVT prophylaxis : SCD  TOTAL TIME TAKING CARE OF THIS PATIENT: *50* minutes.    Enedina Finner M.D  [facility]     CC: Primary care physician; Danella Penton, MD

## 2020-03-21 DIAGNOSIS — R001 Bradycardia, unspecified: Secondary | ICD-10-CM

## 2020-03-21 DIAGNOSIS — N179 Acute kidney failure, unspecified: Secondary | ICD-10-CM

## 2020-03-21 DIAGNOSIS — F0390 Unspecified dementia without behavioral disturbance: Secondary | ICD-10-CM

## 2020-03-21 LAB — BASIC METABOLIC PANEL
Anion gap: 9 (ref 5–15)
BUN: 13 mg/dL (ref 8–23)
CO2: 28 mmol/L (ref 22–32)
Calcium: 8.5 mg/dL — ABNORMAL LOW (ref 8.9–10.3)
Chloride: 106 mmol/L (ref 98–111)
Creatinine, Ser: 1.15 mg/dL — ABNORMAL HIGH (ref 0.44–1.00)
GFR calc Af Amer: 54 mL/min — ABNORMAL LOW (ref >60)
GFR calc non Af Amer: 47 mL/min — ABNORMAL LOW (ref >60)
Glucose, Bld: 120 mg/dL — ABNORMAL HIGH (ref 70–99)
Potassium: 3.6 mmol/L (ref 3.5–5.1)
Sodium: 143 mmol/L (ref 135–145)

## 2020-03-21 MED ORDER — HYDRALAZINE HCL 20 MG/ML IJ SOLN
10.0000 mg | INTRAMUSCULAR | Status: DC | PRN
  Administered 2020-03-21 (×2): 10 mg via INTRAVENOUS
  Filled 2020-03-21 (×2): qty 1

## 2020-03-21 MED ORDER — AMLODIPINE BESYLATE 10 MG PO TABS: 10.0000 mg | ORAL_TABLET | Freq: Every day | ORAL | 0 refills | Status: DC

## 2020-03-21 MED ORDER — POTASSIUM CHLORIDE 20 MEQ PO PACK
40.0000 meq | PACK | Freq: Once | ORAL | Status: AC
  Administered 2020-03-21: 40 meq via ORAL
  Filled 2020-03-21: qty 2

## 2020-03-21 NOTE — TOC Transition Note (Signed)
Transition of Care [facility]) - CM/SW Discharge Note   Patient Details  Name: Julie Davis MRN: [id number] Date of Birth: Dec 19, 1945  Transition of Care [facility] CM/SW Contact:  Norvel Richards, RN Phone Number: 03/21/2020, 3:26 PM   Clinical Narrative:    Spoke to patient and daughter at bedside. Discussed home health services. Pt/family agree to PT services from [facility] to start Thursday. Discussed safety at home. Pt has rollator at home.   Final next level of care: Home w Home Health Services Barriers to Discharge: No Barriers Identified   Patient Goals and CMS Choice Patient states their goals for this hospitalization and ongoing recovery are:: Feel better and get home CMS Medicare.gov Compare Post Acute Care list provided to:: Patient Choice offered to / list presented to : Patient, Adult Children  Discharge Placement                       Discharge Plan and Services   Discharge Planning Services: CM Consult Post Acute Care Choice: Home Health                    HH Arranged: PT [facility] Agency: [facility] Date [facility] Agency Contacted: 03/21/20 Time HH Agency Contacted: 1520 Representative spoke with at [facility] Agency: Enrique Sack  Social Determinants of Health (SDOH) Interventions     Readmission Risk Interventions No flowsheet data found.

## 2020-03-21 NOTE — Discharge Summary (Addendum)
Physician Discharge Summary  Julie Davis:[id number] DOB: 1945/09/06 DOA: 03/20/2020  PCP: Danella Penton, MD  Admit date: 03/20/2020 Discharge date: 03/21/2020  Admitted From: Home  Disposition:  Home   Recommendations for Outpatient Follow-up and new medication changes:  1. Follow up with Dr. Hyacinth Meeker in 7 days.  2. Metoprolol has been discontinued and patient has been started on amlodipine 10 mg for blood pressure control.   I spoke with patient's daughter at the bedside, we talked in detail about patient's condition, plan of care and prognosis and all questions were addressed.   Home Health: yes   Equipment/Devices: na   Discharge Condition: stable  CODE STATUS: full  Diet recommendation: heart healthy   Brief/Interim Summary: Patient admitted to the hospital for working diagnosis of syncope due to bradycardia due to pharmacologic AV blockade.  74 year old female with past medical history for dementia, arthritis, asthma, hypertension, GERD and sleep apnea. Patient was brought to the hospital due to a recurrent syncope episode. She had 3 episodes in 2 weeks. On the day of admission she was standing eating an ice cream when her eyes rolled back and fall backwards sustaining head trauma. She loss her consciuousness for for a brief period of time, with a full recovery. Prior episodes occurred in similar situation, eating and standing in front of the refrigerator.  Patient has been bradycardic for several weeks, she reported having weakness, fatigue and decreased physical functional activity. Positive dizziness.  Dr Hyacinth Meeker had reduced dose of metoprolol as outpatient.  On her initial physical examination blood pressure 106/84, heart rate 53, temperature 98.7, respiratory rate 15, oxygen saturation 98%. Her lungs were clear to auscultation bilaterally, heart S1-S2, present but bradycardic, abdomen soft and nontender, no lower extremity edema, patient was awake and alert. Sodium 139,  potassium 2.8, chloride 98, bicarb 29, glucose 130, BUN 16, creatinine 1.34, troponin I 21, white count 8.3, hemoglobin 14.2, hematocrit 41.2, platelets 199. SARS COVID-19 negative. Urinalysis more than 300 protein, specific gravity 1.003, 0-5 red cells, 0-5 white cells. CT head with left parietal scalp hematoma. CT cervical spine no acute changes. Chest radiograph with hyperinflation, bibasilar atelectasis. EKG 58 bpm, left axis deviation, left anterior fascicular block, manually measured QTC 440, sinus rhythm with bigeminy, no significant ST segment or T wave changes.  1.  Cardiogenic syncope due to bradycardia, due to pharmacologic induced AV blockade, metoprolol.  Patient was admitted to the medical ward, she was placed on a telemetry monitor.  AV blockade was discontinued. Further work-up with echocardiography showed preserved LV and RV systolic function.  No significant valvular disease.  Her telemetry showed sinus rhythm with a heart rate persistently above 65 bpm.  Patient was evaluated by physical therapy/occupational therapy, recommendations for home health services.  2.  Hypertension/ poorly controlled.  Patient has been placed on amlodipine 10 mg daily for blood pressure control, her systolic blood pressure at discharge 159 to 180 mmHg.   3.  Acute kidney injury/hypokalemia.  Patient received IV fluids and potassium was corrected with potassium chloride. Discharge potassium 3.6, sodium 143, chloride 106, bicarb 28, glucose 120, BUN 13, creatinine 1.15.  4.  Left parietal scalp hematoma.  Conservative care, follow-up as an outpatient.  Will hold aspirin for now, follow-up as an outpatient.  5.  Dementia/anxiety.  Continue Aricept and alprazolam, patient with no significant confusion or agitation  Discharge Diagnoses:  Principal Problem:   Symptomatic bradycardia Active Problems:   Bradycardia   Hypokalemia   Severe hypertension  Syncope   AKI (acute kidney injury) (HCC)    Dementia (HCC)    Discharge Instructions   Allergies as of 03/21/2020      Reactions   Methotrexate Derivatives Diarrhea      Medication List    STOP taking these medications   metoprolol tartrate 25 MG tablet Commonly known as: LOPRESSOR     TAKE these medications   alendronate 70 MG tablet Commonly known as: FOSAMAX Take 70 mg by mouth every Monday. Take with a full glass of water on an empty stomach.   ALPRAZolam 0.5 MG tablet Commonly known as: XANAX Take 0.5 mg by mouth 2 (two) times daily as needed for anxiety.   amLODipine 10 MG tablet Commonly known as: NORVASC Take 1 tablet (10 mg total) by mouth daily. Start taking on: March 22, 2020   atorvastatin 10 MG tablet Commonly known as: LIPITOR Take 10 mg by mouth daily.   donepezil 10 MG tablet Commonly known as: ARICEPT Take 10 mg by mouth daily.   escitalopram 10 MG tablet Commonly known as: LEXAPRO Take 10 mg by mouth daily.   pantoprazole 40 MG tablet Commonly known as: PROTONIX Take 40 mg by mouth daily.   traMADol 50 MG tablet Commonly known as: ULTRAM Take 100 mg by mouth 3 (three) times daily as needed for moderate pain or severe pain.       Follow-up Information    Lamar Blinks, MD Follow up in 1 week(s).   Specialty: Cardiology Contact information: [address] [facility] [place] [address] [phone]              Allergies  Allergen Reactions  . Methotrexate Derivatives Diarrhea    Consultations:  Cardiology    Procedures/Studies: DG Chest 2 View  Result Date: 03/19/2020 CLINICAL DATA:  Shortness of breath EXAM: CHEST - 2 VIEW COMPARISON:  None. FINDINGS: Cardiomegaly. Hyperinflation of the lungs. Increased markings in the lung bases. No effusions. No acute bony abnormality. IMPRESSION: Cardiomegaly, hyperinflation.  Bibasilar atelectasis or infiltrates. Electronically Signed   By: Charlett Nose M.D.   On: 03/19/2020 21:02   CT  Head Wo Contrast  Result Date: 03/19/2020 CLINICAL DATA:  Nonspecific dizziness. Fall today. Hematoma to back of head. Patient reports loss of consciousness. EXAM: CT HEAD WITHOUT CONTRAST TECHNIQUE: Contiguous axial images were obtained from the base of the skull through the vertex without intravenous contrast. COMPARISON:  None. FINDINGS: Brain: Moderate generalized atrophy and chronic small vessel ischemia. No intracranial hemorrhage, mass effect, or midline shift. No hydrocephalus. The basilar cisterns are patent. No evidence of territorial infarct or acute ischemia. No extra-axial or intracranial fluid collection. Vascular: Atherosclerosis of skullbase vasculature without hyperdense vessel or abnormal calcification. Skull: No fracture or focal lesion. Sinuses/Orbits: Paranasal sinuses and mastoid air cells are clear. The visualized orbits are unremarkable. Other: Left parietal scalp hematoma. IMPRESSION: 1. Left parietal scalp hematoma. No acute intracranial abnormality. No skull fracture. 2. Moderate atrophy and chronic small vessel ischemia. Electronically Signed   By: Narda Rutherford M.D.   On: 03/19/2020 20:41   CT Cervical Spine Wo Contrast  Result Date: 03/19/2020 CLINICAL DATA:  Nonspecific dizziness. Fall today. Hematoma to back of head. Patient reports loss of consciousness. EXAM: CT CERVICAL SPINE WITHOUT CONTRAST TECHNIQUE: Multidetector CT imaging of the cervical spine was performed without intravenous contrast. Multiplanar CT image reconstructions were also generated. COMPARISON:  None. FINDINGS: Alignment: Straightening of normal lordosis. 3 mm anterolisthesis of C4 on C5 is  likely degenerative and facet mediated. Trace anterolisthesis of T1 on T2 also likely degenerative. No traumatic subluxation. Skull base and vertebrae: No acute fracture. Vertebral body heights are maintained. The dens and skull base are intact. Incidental non fusion posterior arch of C1. Soft tissues and spinal canal:  No prevertebral fluid or swelling. No visible canal hematoma. Disc levels: Disc space narrowing and endplate spurring at multiple levels from C3-C4 through C7-T1. Posterior disc osteophyte complex at C5-C6 causes narrowing of the bony canal. Scattered multilevel facet hypertrophy and neural foraminal stenosis. Upper chest: No acute findings. Other: Carotid calcifications. IMPRESSION: Multilevel degenerative change throughout the cervical spine without acute fracture or subluxation. Electronically Signed   By: Narda Rutherford M.D.   On: 03/19/2020 20:45   ECHOCARDIOGRAM COMPLETE  Result Date: 03/20/2020    ECHOCARDIOGRAM REPORT   Patient Name:   TANINA BARB Date of Exam: 03/20/2020 Medical Rec #:  [id number]        Height:       63.0 in Accession #:    [id number]       Weight:       165.0 lb Date of Birth:  [birth year]       BSA:          1.782 m Patient Age:    73 years         BP:           248/65 mmHg Patient Gender: F                HR:           49 bpm. Exam Location:  [facility] Procedure: 2D Echo, Color Doppler and Cardiac Doppler Indications:     Abnormal ECG 794.31  History:         Patient has no prior history of Echocardiogram examinations.                  Risk Factors:Sleep Apnea and Hypertension.  Sonographer:     Cristela Blue RDCS (AE) Referring Phys:  [id number] SONA PATEL Diagnosing Phys: Arnoldo Hooker MD IMPRESSIONS  1. Left ventricular ejection fraction, by estimation, is 60 to 65%. The left ventricle has normal function. The left ventricle has no regional wall motion abnormalities. Left ventricular diastolic parameters were normal.  2. Right ventricular systolic function is normal. The right ventricular size is normal. There is mildly elevated pulmonary artery systolic pressure.  3. The mitral valve is normal in structure. Trivial mitral valve regurgitation.  4. The aortic valve is normal in structure. Aortic valve regurgitation is not visualized. FINDINGS  Left Ventricle: Left ventricular ejection  fraction, by estimation, is 60 to 65%. The left ventricle has normal function. The left ventricle has no regional wall motion abnormalities. The left ventricular internal cavity size was normal in size. There is  no left ventricular hypertrophy. Left ventricular diastolic parameters were normal. Right Ventricle: The right ventricular size is normal. No increase in right ventricular wall thickness. Right ventricular systolic function is normal. There is mildly elevated pulmonary artery systolic pressure. The tricuspid regurgitant velocity is 2.62  m/s, and with an assumed right atrial pressure of 10 mmHg, the estimated right ventricular systolic pressure is 37.5 mmHg. Left Atrium: Left atrial size was normal in size. Right Atrium: Right atrial size was normal in size. Pericardium: There is no evidence of pericardial effusion. Mitral Valve: The mitral valve is normal in structure. Trivial mitral valve regurgitation. Tricuspid Valve: The tricuspid valve is normal in structure.  Tricuspid valve regurgitation is trivial. Aortic Valve: The aortic valve is normal in structure. Aortic valve regurgitation is not visualized. Aortic valve mean gradient measures 7.0 mmHg. Aortic valve peak gradient measures 14.3 mmHg. Aortic valve area, by VTI measures 2.21 cm. Pulmonic Valve: The pulmonic valve was normal in structure. Pulmonic valve regurgitation is trivial. Aorta: The aortic root and ascending aorta are structurally normal, with no evidence of dilitation. IAS/Shunts: No atrial level shunt detected by color flow Doppler.  LEFT VENTRICLE PLAX 2D LVIDd:         4.74 cm  Diastology LVIDs:         2.73 cm  LV e' medial:   5.77 cm/s LV PW:         1.22 cm  LV E/e' medial: 9.6 LV IVS:        0.92 cm LVOT diam:     2.00 cm LV SV:         94 LV SV Index:   53 LVOT Area:     3.14 cm  RIGHT VENTRICLE RV Basal diam:  2.40 cm RV S prime:     15.30 cm/s TAPSE (M-mode): 3.2 cm LEFT ATRIUM             Index       RIGHT ATRIUM            Index LA diam:        3.20 cm 1.80 cm/m  RA Area:     17.00 cm LA Vol (A2C):   60.0 ml 33.67 ml/m RA Volume:   42.20 ml  23.68 ml/m LA Vol (A4C):   60.9 ml 34.18 ml/m LA Biplane Vol: 60.9 ml 34.18 ml/m  AORTIC VALVE                    PULMONIC VALVE AV Area (Vmax):    2.14 cm     PV Vmax:        0.32 m/s AV Area (Vmean):   2.09 cm     PV Peak grad:   0.4 mmHg AV Area (VTI):     2.21 cm     RVOT Peak grad: 3 mmHg AV Vmax:           189.00 cm/s AV Vmean:          121.000 cm/s AV VTI:            0.426 m AV Peak Grad:      14.3 mmHg AV Mean Grad:      7.0 mmHg LVOT Vmax:         129.00 cm/s LVOT Vmean:        80.600 cm/s LVOT VTI:          0.300 m LVOT/AV VTI ratio: 0.70  AORTA Ao Root diam: 2.60 cm MITRAL VALVE                TRICUSPID VALVE MV Area (PHT): 3.12 cm     TR Peak grad:   27.5 mmHg MV Decel Time: 243 msec     TR Vmax:        262.00 cm/s MV E velocity: 55.30 cm/s MV A velocity: 131.00 cm/s  SHUNTS MV E/A ratio:  0.42         Systemic VTI:  0.30 m                             Systemic Diam: 2.00  cm Arnoldo Hooker MD Electronically signed by Arnoldo Hooker MD Signature Date/Time: 03/20/2020/1:15:57 PM    Final        Subjective: Patient is feeling better, no nausea or vomiting, no headache, no chest pain or dyspnea. No further syncope episodes.   Discharge Exam: Vitals:   03/21/20 1104 03/21/20 1446  BP: (!) 159/41 (!) 180/60  Pulse: 70 84  Resp: 16 16  Temp: 98.9 F (37.2 C) 99.2 F (37.3 C)  SpO2: 96% 97%   Vitals:   03/21/20 0527 03/21/20 0855 03/21/20 1104 03/21/20 1446  BP: (!) 168/86 (!) 187/76 (!) 159/41 (!) 180/60  Pulse: 69 65 70 84  Resp: Temp: 98.4 F (36.9 C) 98.3 F (36.8 C) 98.9 F (37.2 C) 99.2 F (37.3 C)  TempSrc: Oral Oral Oral Oral  SpO2: 97% 98% 96% 97%  Weight:      Height:        General: Not in pain or dyspnea  Neurology: Awake and alert, non focal  E ENT: no pallor, no icterus, oral mucosa moist Cardiovascular: No JVD. S1-S2  present, rhythmic, no gallops, rubs, or murmurs. No lower extremity edema. Pulmonary: positive breath sounds bilaterally, adequate air movement, no wheezing, rhonchi or rales. Gastrointestinal. Abdomen soft and non tender Skin. No rashes Musculoskeletal: no joint deformities   The results of significant diagnostics from this hospitalization (including imaging, microbiology, ancillary and laboratory) are listed below for reference.     Microbiology: Recent Results (from the past 240 hour(s))  SARS Coronavirus 2 by RT PCR (hospital order, performed in [facility] hospital lab) Nasopharyngeal Nasopharyngeal Swab     Status: None   Collection Time: 03/20/20  2:57 AM   Specimen: Nasopharyngeal Swab  Result Value Ref Range Status   SARS Coronavirus 2 NEGATIVE NEGATIVE Final    Comment: (NOTE) SARS-CoV-2 target nucleic acids are NOT DETECTED.  The SARS-CoV-2 RNA is generally detectable in upper and lower respiratory specimens during the acute phase of infection. The lowest concentration of SARS-CoV-2 viral copies this assay can detect is 250 copies / mL. A negative result does not preclude SARS-CoV-2 infection and should not be used as the sole basis for treatment or other patient management decisions.  A negative result may occur with improper specimen collection / handling, submission of specimen other than nasopharyngeal swab, presence of viral mutation(s) within the areas targeted by this assay, and inadequate number of viral copies (<250 copies / mL). A negative result must be combined with clinical observations, patient history, and epidemiological information.  Fact Sheet for Patients:   BoilerBrush.com.cy  Fact Sheet for Healthcare Providers: https://pope.com/  This test is not yet approved or  cleared by the [place] FDA and has been authorized for detection and/or diagnosis of SARS-CoV-2 by FDA under an Emergency Use  Authorization (EUA).  This EUA will remain in effect (meaning this test can be used) for the duration of the COVID-19 declaration under Section 564(b)(1) of the Act, 21 U.S.C. section 360bbb-3(b)(1), unless the authorization is terminated or revoked sooner.  Performed at [facility], [address] [place], [place] [address]      Labs: BNP (last 3 results) No results for input(s): BNP in the last 8760 hours. Basic Metabolic Panel: Recent Labs  Lab 03/19/20 2012 03/20/20 0257 03/20/20 1848 03/21/20 0636  NA 139  --   --  143  K 2.8*  --  3.0* 3.6  CL 98  --   --  106  CO2 29  --   --  28  GLUCOSE 130*  --   --  120*  BUN 16  --   --  13  CREATININE 1.34*  --   --  1.15*  CALCIUM 8.6*  --   --  8.5*  MG  --  1.8  --   --    Liver Function Tests: No results for input(s): AST, ALT, ALKPHOS, BILITOT, PROT, ALBUMIN in the last 168 hours. No results for input(s): LIPASE, AMYLASE in the last 168 hours. No results for input(s): AMMONIA in the last 168 hours. CBC: Recent Labs  Lab 03/19/20 2012  WBC 8.3  HGB 14.2  HCT 41.2  MCV 89.0  PLT 199   Cardiac Enzymes: No results for input(s): CKTOTAL, CKMB, CKMBINDEX, TROPONINI in the last 168 hours. BNP: Invalid input(s): POCBNP CBG: No results for input(s): GLUCAP in the last 168 hours. D-Dimer No results for input(s): DDIMER in the last 72 hours. Hgb A1c No results for input(s): HGBA1C in the last 72 hours. Lipid Profile No results for input(s): CHOL, HDL, LDLCALC, TRIG, CHOLHDL, LDLDIRECT in the last 72 hours. Thyroid function studies Recent Labs    03/20/20 0257  TSH 4.994*   Anemia work up No results for input(s): VITAMINB12, FOLATE, FERRITIN, TIBC, IRON, RETICCTPCT in the last 72 hours. Urinalysis    Component Value Date/Time   COLORURINE STRAW (A) 03/20/2020 0257   APPEARANCEUR CLEAR (A) 03/20/2020 0257   LABSPEC 1.003 (L) 03/20/2020 0257   PHURINE 8.0 03/20/2020 0257   GLUCOSEU NEGATIVE  03/20/2020 0257   HGBUR SMALL (A) 03/20/2020 0257   BILIRUBINUR NEGATIVE 03/20/2020 0257   KETONESUR NEGATIVE 03/20/2020 0257   PROTEINUR >=300 (A) 03/20/2020 0257   NITRITE NEGATIVE 03/20/2020 0257   LEUKOCYTESUR NEGATIVE 03/20/2020 0257   Sepsis Labs Invalid input(s): PROCALCITONIN,  WBC,  LACTICIDVEN Microbiology Recent Results (from the past 240 hour(s))  SARS Coronavirus 2 by RT PCR (hospital order, performed in [facility] hospital lab) Nasopharyngeal Nasopharyngeal Swab     Status: None   Collection Time: 03/20/20  2:57 AM   Specimen: Nasopharyngeal Swab  Result Value Ref Range Status   SARS Coronavirus 2 NEGATIVE NEGATIVE Final    Comment: (NOTE) SARS-CoV-2 target nucleic acids are NOT DETECTED.  The SARS-CoV-2 RNA is generally detectable in upper and lower respiratory specimens during the acute phase of infection. The lowest concentration of SARS-CoV-2 viral copies this assay can detect is 250 copies / mL. A negative result does not preclude SARS-CoV-2 infection and should not be used as the sole basis for treatment or other patient management decisions.  A negative result may occur with improper specimen collection / handling, submission of specimen other than nasopharyngeal swab, presence of viral mutation(s) within the areas targeted by this assay, and inadequate number of viral copies (<250 copies / mL). A negative result must be combined with clinical observations, patient history, and epidemiological information.  Fact Sheet for Patients:   BoilerBrush.com.cy  Fact Sheet for Healthcare Providers: https://pope.com/  This test is not yet approved or  cleared by the [place] FDA and has been authorized for detection and/or diagnosis of SARS-CoV-2 by FDA under an Emergency Use Authorization (EUA).  This EUA will remain in effect (meaning this test can be used) for the duration of the COVID-19 declaration under  Section 564(b)(1) of the Act, 21 U.S.C. section 360bbb-3(b)(1), unless the authorization is terminated or revoked sooner.  Performed at [facility], [address] [place], [place] [address]      Time coordinating discharge: 45 minutes  SIGNED:   Coralie Keens, MD  [facility] 03/21/2020, 3:01 PM

## 2020-03-21 NOTE — Consult Note (Signed)
PHARMACY CONSULT NOTE  Pharmacy Consult for Electrolyte Monitoring and Replacement   Recent Labs: Potassium (mmol/L)  Date Value  03/21/2020 3.6   Magnesium (mg/dL)  Date Value  16/04/9603 1.8   Calcium (mg/dL)  Date Value  54/03/8118 8.5 (L)   Sodium (mmol/L)  Date Value  03/21/2020 143   Assessment: 74 year old female with PMH of dementia, arthritis, asthma, HTN, GERD, and sleep apnea presenting to ED with syncopal episode, bradycardia, and hypokalemia.   Goal of Therapy:  Electrolytes WNL  Plan:  No replacement needed at this time.  - f/u electrolytes with AM labs   Paschal Dopp, PharmD, BCPS Clinical Pharmacist 03/21/2020

## 2020-03-21 NOTE — Plan of Care (Signed)

## 2020-03-21 NOTE — Evaluation (Signed)
Physical Therapy Evaluation Patient Details Name: Julie Davis MRN: [id number] DOB: 1946/03/19 Today's Date: 03/21/2020   History of Present Illness  Pt is a 74 year old female with PMH of dementia, arthritis, asthma, HTN, GERD, and sleep apnea presenting to ED with syncopal episode, bradycardia, and hypokalemia.    Clinical Impression  Pt alert, family at bedside. Oriented to her name, location as hospital (and knew to look to whiteboard for information), disoriented to situation/time, per family very near her cognitive baseline. Pt able to report some PLOF, verified with family. Pt has had 3 falls in the last two weeks, reported "blacking out". Normally does not ambulate with AD, dresses independently, supervision for showering as needed. Assists with household tasks as able.  The patient demonstrated UE WFLs, some generalized LE weakness present. Supine to sit with supervision, and able to sit EOB without support and brush her hair. Sit <> stand with CGA/supervision and ambulated ~121ft. Pt able to maintain balance and ambulate short distances without support, but improvement in steadiness, gait velocity, and confidence noted with at least unilateral UE support. Pt and family educated about use of RW upon returning home to provide more safety, verbalized understanding.  Overall the patient demonstrated deficits (see "PT Problem List") that impede the patient's functional abilities, safety, and mobility and would benefit from skilled PT intervention. Recommendation is HHPT and supervision for mobility/OOB to maximize safety, independence and mobility.     Follow Up Recommendations Home health PT;Supervision for mobility/OOB    Equipment Recommendations  None recommended by PT;Other (comment) (pt has SPC rollator and RW at home)    Recommendations for Other Services       Precautions / Restrictions Precautions Precautions: Fall Restrictions Weight Bearing Restrictions: No       Mobility  Bed Mobility Overal bed mobility: Needs Assistance Bed Mobility: Supine to Sit     Supine to sit: Supervision;HOB elevated        Transfers Overall transfer level: Needs assistance Equipment used: 1 person hand held assist Transfers: Sit to/from Stand Sit to Stand: Supervision;Min guard            Ambulation/Gait   Gait Distance (Feet): 150 Feet Assistive device: 1 person hand held assist       General Gait Details: pt utilized handrail during ambulation and intermittent UE support from PT. seemed to be more comfortable with support, verbalized agreement to use RW at home. HR from 70s-90s.  Stairs            Wheelchair Mobility    Modified Rankin (Stroke Patients Only)       Balance Overall balance assessment: Needs assistance Sitting-balance support: Feet supported Sitting balance-Leahy Scale: Normal       Standing balance-Leahy Scale: Good Standing balance comment: pt more comfortable with at least unilateral support                             Pertinent Vitals/Pain Pain Assessment: Faces Faces Pain Scale: Hurts a little bit Pain Location: back of her head when returning to chair and head touched the pillow Pain Descriptors / Indicators: Grimacing;Moaning Pain Intervention(s): Limited activity within patient's tolerance;Monitored during session;Repositioned    Home Living Family/patient expects to be discharged to:: Private residence Living Arrangements: Children;Spouse/significant other Available Help at Discharge: Family;Available 24 hours/day Type of Home: House Home Access: Stairs to enter Entrance Stairs-Rails: Doctor, general practice of Steps: 3-4 Home Layout: One level Home Equipment:  Shower seat;Walker - 2 wheels;Walker - 4 wheels      Prior Function Level of Independence: Needs assistance   Gait / Transfers Assistance Needed: ambulates without device normally, does not need physical assist for  mobility usually  ADL's / Homemaking Assistance Needed: pt does perform light household tasks, does not drive  Comments: 3 falls in the last two weeks, pt reported that she "blacks out"     Hand Dominance   Dominant Hand: Right    Extremity/Trunk Assessment   Upper Extremity Assessment Upper Extremity Assessment: Overall WFL for tasks assessed    Lower Extremity Assessment Lower Extremity Assessment: Generalized weakness    Cervical / Trunk Assessment Cervical / Trunk Assessment: Normal  Communication   Communication: No difficulties  Cognition Arousal/Alertness: Awake/alert Behavior During Therapy: WFL for tasks assessed/performed Overall Cognitive Status: History of cognitive impairments - at baseline                                 General Comments: pt oriented to self, oriented to location (knew it was a hospital, and to check the whiteboard for info). Confusion noted with PLOF, family able to verify as needed      General Comments      Exercises Other Exercises Other Exercises: Pt able to sit EOB for several minutes without UE/LE support, brushed her hair when handed a comb. Other Exercises: Pt tray set up for breakfast, able to reach outside BOS for items, but ultimately needed assistance to open food items   Assessment/Plan    PT Assessment Patient needs continued PT services  PT Problem List Decreased strength;Decreased activity tolerance;Decreased balance;Decreased mobility;Pain       PT Treatment Interventions DME instruction;Balance training;Gait training;Neuromuscular re-education;Stair training;Functional mobility training;Patient/family education;Therapeutic activities;Therapeutic exercise    PT Goals (Current goals can be found in the Care Plan section)  Acute Rehab PT Goals Patient Stated Goal: to stop falling PT Goal Formulation: With patient Time For Goal Achievement: 04/04/20 Potential to Achieve Goals: Good    Frequency Min  2X/week   Barriers to discharge        Co-evaluation               AM-PAC PT "6 Clicks" Mobility  Outcome Measure Help needed turning from your back to your side while in a flat bed without using bedrails?: None Help needed moving from lying on your back to sitting on the side of a flat bed without using bedrails?: None Help needed moving to and from a bed to a chair (including a wheelchair)?: None Help needed standing up from a chair using your arms (e.g., wheelchair or bedside chair)?: A Little Help needed to walk in hospital room?: A Little Help needed climbing 3-5 steps with a railing? : A Little 6 Click Score: 21    End of Session Equipment Utilized During Treatment: Gait belt Activity Tolerance: Patient tolerated treatment well Patient left: in chair;with call bell/phone within reach;with chair alarm set;with nursing/sitter in room Nurse Communication: Mobility status PT Visit Diagnosis: Muscle weakness (generalized) (M62.81);History of falling (Z91.81);Difficulty in walking, not elsewhere classified (R26.2)    Time: 7673-4193 PT Time Calculation (min) (ACUTE ONLY): 30 min   Charges:   PT Evaluation $PT Eval Low Complexity: 1 Low PT Treatments $Gait Training: 8-22 mins        Olga Coaster PT, DPT 11:08 AM,03/21/20

## 2020-03-21 NOTE — Progress Notes (Signed)
03/21/20 0230 03/21/20 0327 03/21/20 0336  Assess: MEWS Score  Temp 98 F (36.7 C)  --  98.7 F (37.1 C)  BP (!) 201/66  --  (!) 168/140  Pulse Rate 66  --  72  Resp 18  --  17  SpO2 96 %  --  94 %  O2 Device Room Air  --  Room Air  Assess: MEWS Score  MEWS Temp 0  --  0  MEWS Systolic 2  --  0  MEWS Pulse 0  --  0  MEWS RR 0  --  0  MEWS LOC 0  --  0  MEWS Score 2  --  0  MEWS Score Color Yellow  --  Green  Assess: if the MEWS score is Yellow or Red  Were vital signs taken at a resting state? Yes  --   --   Focused Assessment No change from prior assessment  --   --   Early Detection of Sepsis Score *See Row Information* Low  --   --   MEWS guidelines implemented *See Row Information* Yes  --   --   Treat  MEWS Interventions Administered prn meds/treatments  --   --   Take Vital Signs  Increase Vital Sign Frequency  Yellow: Q 2hr X 2 then Q 4hr X 2, if remains yellow, continue Q 4hrs  --   --   Notify: Charge Nurse/RN  Name of Charge Nurse/RN Notified Social research officer, government  --   --   Date Charge Nurse/RN Notified 03/21/20  --   --   Time Charge Nurse/RN Notified 0326  --   --   Notify: Provider  Provider Name/Title Steward Drone Morrison-Np  --   --   Date Provider Notified 03/21/20  --   --   Time Provider Notified [phone]  --   --   Notification Type Page  --   --   Notification Reason Other (Comment) (BP of 201/66)  --   --   Response Other (Comment) (Waiting for response) See new orders  --   Date of Provider Response  --  03/21/20  --   Time of Provider Response  --  0327  --   Document  Patient Outcome  --  Stabilized after interventions  --   Progress note created (see row info)  --  Yes  --     03/21/20 0338  Assess: MEWS Score  Temp  --   BP (!) 170/54  Pulse Rate 69  Resp  --   SpO2  --   O2 Device  --   Assess: MEWS Score  MEWS Temp 0  MEWS Systolic 0  MEWS Pulse 0  MEWS RR 0  MEWS LOC 0  MEWS Score 0  MEWS Score Color Green  Assess: if the MEWS score is Yellow  or Red  Were vital signs taken at a resting state?  --   Focused Assessment  --   Early Detection of Sepsis Score *See Row Information*  --   MEWS guidelines implemented *See Row Information*  --   Treat  MEWS Interventions  --   Take Vital Signs  Increase Vital Sign Frequency   --   Notify: Charge Nurse/RN  Name of Charge Nurse/RN Notified  --   Date Charge Nurse/RN Notified  --   Time Charge Nurse/RN Notified  --   Notify: Provider  Provider Name/Title  --   Date Provider Notified  --  Time Provider Notified  --   Notification Type  --   Notification Reason  --   Response  --   Date of Provider Response  --   Time of Provider Response  --   Document  Patient Outcome  --   Progress note created (see row info)  --

## 2020-03-21 NOTE — Progress Notes (Signed)
[facility] Cardiology [facility] Encounter Note  Patient: Julie Davis / Admit Date: 03/20/2020 / Date of Encounter: 03/21/2020, 9:16 AM   Subjective: Patient has felt much better overnight.  No evidence of syncope weakness fatigue dizziness.  Telemetry shows normal sinus rhythm at a much higher heart rate into the 80 bpm range.  Patient does have a little bit of sundowning concerning to the family.  No other cardiovascular symptoms chest pain or congestive heart failure symptoms at this time.  Review of Systems: Positive for: None Negative for: Vision change, hearing change, syncope, dizziness, nausea, vomiting,diarrhea, bloody stool, stomach pain, cough, congestion, diaphoresis, urinary frequency, urinary pain,skin lesions, skin rashes Others previously listed  Objective: Telemetry: Normal sinus rhythm Physical Exam: Blood pressure (!) 187/76, pulse 65, temperature 98.3 F (36.8 C), temperature source Oral, resp. rate 18, height 5\' 3"  (1.6 m), weight 74.8 kg, SpO2 98 %. Body mass index is 29.23 kg/m. General: Well developed, well nourished, in no acute distress. Head: Normocephalic, atraumatic, sclera non-icteric, no xanthomas, nares are without discharge. Neck: No apparent masses Lungs: Normal respirations with no wheezes, no rhonchi, no rales , no crackles   Heart: Regular rate and rhythm, normal S1 S2, no murmur, no rub, no gallop, PMI is normal size and placement, carotid upstroke normal without bruit, jugular venous pressure normal Abdomen: Soft, non-tender, non-distended with normoactive bowel sounds. No hepatosplenomegaly. Abdominal aorta is normal size without bruit Extremities: No edema, no clubbing, no cyanosis, no ulcers,  Peripheral: 2+ radial, 2+ femoral, 2+ dorsal pedal pulses Neuro: Alert and oriented. Moves all extremities spontaneously. Psych:  Responds to questions appropriately with a normal affect.  No intake or output data in the 24 hours ending 03/21/20  0916  Inpatient Medications:   amLODipine  10 mg Oral Daily   atorvastatin  10 mg Oral Daily   pantoprazole  40 mg Oral Daily   Vitamin D (Ergocalciferol)  50,000 Units Oral Weekly   Infusions:   Labs: Recent Labs    03/19/20 2012 03/19/20 2012 03/20/20 0257 03/20/20 1848 03/21/20 0636  NA 139  --   --   --  143  K 2.8*   < >  --  3.0* 3.6  CL 98  --   --   --  106  CO2 29  --   --   --  28  GLUCOSE 130*  --   --   --  120*  BUN 16  --   --   --  13  CREATININE 1.34*  --   --   --  1.15*  CALCIUM 8.6*  --   --   --  8.5*  MG  --   --  1.8  --   --    < > = values in this interval not displayed.   No results for input(s): AST, ALT, ALKPHOS, BILITOT, PROT, ALBUMIN in the last 72 hours. Recent Labs    03/19/20 2012  WBC 8.3  HGB 14.2  HCT 41.2  MCV 89.0  PLT 199   No results for input(s): CKTOTAL, CKMB, TROPONINI in the last 72 hours. Invalid input(s): POCBNP No results for input(s): HGBA1C in the last 72 hours.   Weights: Filed Weights   03/19/20 2010  Weight: 74.8 kg     Radiology/Studies:  DG Chest 2 View  Result Date: 03/19/2020 CLINICAL DATA:  Shortness of breath EXAM: CHEST - 2 VIEW COMPARISON:  None. FINDINGS: Cardiomegaly. Hyperinflation of the lungs. Increased markings in the  lung bases. No effusions. No acute bony abnormality. IMPRESSION: Cardiomegaly, hyperinflation.  Bibasilar atelectasis or infiltrates. Electronically Signed   By: Charlett Nose M.D.   On: 03/19/2020 21:02   CT Head Wo Contrast  Result Date: 03/19/2020 CLINICAL DATA:  Nonspecific dizziness. Fall today. Hematoma to back of head. Patient reports loss of consciousness. EXAM: CT HEAD WITHOUT CONTRAST TECHNIQUE: Contiguous axial images were obtained from the base of the skull through the vertex without intravenous contrast. COMPARISON:  None. FINDINGS: Brain: Moderate generalized atrophy and chronic small vessel ischemia. No intracranial hemorrhage, mass effect, or midline shift. No  hydrocephalus. The basilar cisterns are patent. No evidence of territorial infarct or acute ischemia. No extra-axial or intracranial fluid collection. Vascular: Atherosclerosis of skullbase vasculature without hyperdense vessel or abnormal calcification. Skull: No fracture or focal lesion. Sinuses/Orbits: Paranasal sinuses and mastoid air cells are clear. The visualized orbits are unremarkable. Other: Left parietal scalp hematoma. IMPRESSION: 1. Left parietal scalp hematoma. No acute intracranial abnormality. No skull fracture. 2. Moderate atrophy and chronic small vessel ischemia. Electronically Signed   By: Narda Rutherford M.D.   On: 03/19/2020 20:41   CT Cervical Spine Wo Contrast  Result Date: 03/19/2020 CLINICAL DATA:  Nonspecific dizziness. Fall today. Hematoma to back of head. Patient reports loss of consciousness. EXAM: CT CERVICAL SPINE WITHOUT CONTRAST TECHNIQUE: Multidetector CT imaging of the cervical spine was performed without intravenous contrast. Multiplanar CT image reconstructions were also generated. COMPARISON:  None. FINDINGS: Alignment: Straightening of normal lordosis. 3 mm anterolisthesis of C4 on C5 is likely degenerative and facet mediated. Trace anterolisthesis of T1 on T2 also likely degenerative. No traumatic subluxation. Skull base and vertebrae: No acute fracture. Vertebral body heights are maintained. The dens and skull base are intact. Incidental non fusion posterior arch of C1. Soft tissues and spinal canal: No prevertebral fluid or swelling. No visible canal hematoma. Disc levels: Disc space narrowing and endplate spurring at multiple levels from C3-C4 through C7-T1. Posterior disc osteophyte complex at C5-C6 causes narrowing of the bony canal. Scattered multilevel facet hypertrophy and neural foraminal stenosis. Upper chest: No acute findings. Other: Carotid calcifications. IMPRESSION: Multilevel degenerative change throughout the cervical spine without acute fracture or  subluxation. Electronically Signed   By: Narda Rutherford M.D.   On: 03/19/2020 20:45   ECHOCARDIOGRAM COMPLETE  Result Date: 03/20/2020    ECHOCARDIOGRAM REPORT   Patient Name:   Julie Davis HOLFORD Date of Exam: 03/20/2020 Medical Rec #:  [id number]        Height:       63.0 in Accession #:    [id number]       Weight:       165.0 lb Date of Birth:  09/30/1945       BSA:          1.782 m Patient Age:    73 years         BP:           248/65 mmHg Patient Gender: F                HR:           49 bpm. Exam Location:  [facility] Procedure: 2D Echo, Color Doppler and Cardiac Doppler Indications:     Abnormal ECG 794.31  History:         Patient has no prior history of Echocardiogram examinations.                  Risk Factors:Sleep  Apnea and Hypertension.  Sonographer:     Cristela Blue RDCS (AE) Referring Phys:  [id number] SONA PATEL Diagnosing Phys: Arnoldo Hooker MD IMPRESSIONS  1. Left ventricular ejection fraction, by estimation, is 60 to 65%. The left ventricle has normal function. The left ventricle has no regional wall motion abnormalities. Left ventricular diastolic parameters were normal.  2. Right ventricular systolic function is normal. The right ventricular size is normal. There is mildly elevated pulmonary artery systolic pressure.  3. The mitral valve is normal in structure. Trivial mitral valve regurgitation.  4. The aortic valve is normal in structure. Aortic valve regurgitation is not visualized. FINDINGS  Left Ventricle: Left ventricular ejection fraction, by estimation, is 60 to 65%. The left ventricle has normal function. The left ventricle has no regional wall motion abnormalities. The left ventricular internal cavity size was normal in size. There is  no left ventricular hypertrophy. Left ventricular diastolic parameters were normal. Right Ventricle: The right ventricular size is normal. No increase in right ventricular wall thickness. Right ventricular systolic function is normal. There is mildly elevated  pulmonary artery systolic pressure. The tricuspid regurgitant velocity is 2.62  m/s, and with an assumed right atrial pressure of 10 mmHg, the estimated right ventricular systolic pressure is 37.5 mmHg. Left Atrium: Left atrial size was normal in size. Right Atrium: Right atrial size was normal in size. Pericardium: There is no evidence of pericardial effusion. Mitral Valve: The mitral valve is normal in structure. Trivial mitral valve regurgitation. Tricuspid Valve: The tricuspid valve is normal in structure. Tricuspid valve regurgitation is trivial. Aortic Valve: The aortic valve is normal in structure. Aortic valve regurgitation is not visualized. Aortic valve mean gradient measures 7.0 mmHg. Aortic valve peak gradient measures 14.3 mmHg. Aortic valve area, by VTI measures 2.21 cm. Pulmonic Valve: The pulmonic valve was normal in structure. Pulmonic valve regurgitation is trivial. Aorta: The aortic root and ascending aorta are structurally normal, with no evidence of dilitation. IAS/Shunts: No atrial level shunt detected by color flow Doppler.  LEFT VENTRICLE PLAX 2D LVIDd:         4.74 cm  Diastology LVIDs:         2.73 cm  LV e' medial:   5.77 cm/s LV PW:         1.22 cm  LV E/e' medial: 9.6 LV IVS:        0.92 cm LVOT diam:     2.00 cm LV SV:         94 LV SV Index:   53 LVOT Area:     3.14 cm  RIGHT VENTRICLE RV Basal diam:  2.40 cm RV S prime:     15.30 cm/s TAPSE (M-mode): 3.2 cm LEFT ATRIUM             Index       RIGHT ATRIUM           Index LA diam:        3.20 cm 1.80 cm/m  RA Area:     17.00 cm LA Vol (A2C):   60.0 ml 33.67 ml/m RA Volume:   42.20 ml  23.68 ml/m LA Vol (A4C):   60.9 ml 34.18 ml/m LA Biplane Vol: 60.9 ml 34.18 ml/m  AORTIC VALVE                    PULMONIC VALVE AV Area (Vmax):    2.14 cm     PV Vmax:  0.32 m/s AV Area (Vmean):   2.09 cm     PV Peak grad:   0.4 mmHg AV Area (VTI):     2.21 cm     RVOT Peak grad: 3 mmHg AV Vmax:           189.00 cm/s AV Vmean:           121.000 cm/s AV VTI:            0.426 m AV Peak Grad:      14.3 mmHg AV Mean Grad:      7.0 mmHg LVOT Vmax:         129.00 cm/s LVOT Vmean:        80.600 cm/s LVOT VTI:          0.300 m LVOT/AV VTI ratio: 0.70  AORTA Ao Root diam: 2.60 cm MITRAL VALVE                TRICUSPID VALVE MV Area (PHT): 3.12 cm     TR Peak grad:   27.5 mmHg MV Decel Time: 243 msec     TR Vmax:        262.00 cm/s MV E velocity: 55.30 cm/s MV A velocity: 131.00 cm/s  SHUNTS MV E/A ratio:  0.42         Systemic VTI:  0.30 m                             Systemic Diam: 2.00 cm Arnoldo Hooker MD Electronically signed by Arnoldo Hooker MD Signature Date/Time: 03/20/2020/1:15:57 PM    Final      Assessment and Recommendation  74 y.o. female with known hypertension hyperlipidemia chronic kidney disease stage III with episode of syncope dizziness and bradycardia most consistent with medication management causing this issue now resolved with no further evidence of congestive heart failure or myocardial infarction or acute coronary syndrome 1.  Abstain from beta-blocker which likely caused her significant bradycardia and syncope 2.  Continue ambulation following for improvements of symptoms and ability for discharged home 3.  Continue amlodipine for hypertension control 4.  High intensity cholesterol therapy 5.  No further cardiac diagnostics necessary at this time 6.  Okay for discharge home from cardiac standpoint and follow-up in 1 week for further adjustments of medication management.  Call if further questions otherwise assuming patient will be discharged today  Signed, Arnoldo Hooker M.D. FACC

## 2020-03-21 NOTE — Progress Notes (Signed)
bp recheck 138/63n l arm lying - f/u after giving prn hydralazine

## 2020-03-21 NOTE — Progress Notes (Deleted)
    03/21/20 0230 03/21/20 0327  Assess: MEWS Score  Temp 98 F (36.7 C)  --   BP (!) 201/66  --   Pulse Rate 66  --   Resp 18  --   SpO2 96 %  --   O2 Device Room Air  --   Assess: MEWS Score  MEWS Temp 0  --   MEWS Systolic 2  --   MEWS Pulse 0  --   MEWS RR 0  --   MEWS LOC 0  --   MEWS Score 2  --   MEWS Score Color Yellow  --   Assess: if the MEWS score is Yellow or Red  Were vital signs taken at a resting state? Yes  --   Focused Assessment No change from prior assessment  --   Early Detection of Sepsis Score *See Row Information* Low  --   MEWS guidelines implemented *See Row Information* Yes  --   Treat  MEWS Interventions Administered prn meds/treatments  --   Take Vital Signs  Increase Vital Sign Frequency  Yellow: Q 2hr X 2 then Q 4hr X 2, if remains yellow, continue Q 4hrs  --   Notify: Charge Nurse/RN  Name of Charge Nurse/RN Notified Social research officer, government  --   Date Charge Nurse/RN Notified 03/21/20  --   Time Charge Nurse/RN Notified 0326  --   Notify: Provider  Provider Name/Title [facility]lee [facility]  --   Date Provider Notified 03/21/20  --   Time Provider Notified [phone]  --   Notification Type Page  --   Notification Reason Other (Comment) (BP of 201/66)  --   Response Other (Comment) (Waiting for response) See new orders  Date of Provider Response  --  03/21/20  Time of Provider Response  --  [phone]  Document  Patient Outcome  --  Stabilized after interventions  Progress note created (see row info)  --  Yes

## 2020-05-14 ENCOUNTER — Other Ambulatory Visit: Payer: Self-pay | Admitting: Internal Medicine

## 2020-05-14 DIAGNOSIS — Z1231 Encounter for screening mammogram for malignant neoplasm of breast: Secondary | ICD-10-CM

## 2020-06-08 ENCOUNTER — Ambulatory Visit
Admission: RE | Admit: 2020-06-08 | Discharge: 2020-06-08 | Disposition: A | Discharge status: Home / Self Care | Payer: Medicare HMO | Source: Ambulatory Visit | Attending: Internal Medicine | Admitting: Internal Medicine

## 2020-06-08 ENCOUNTER — Other Ambulatory Visit: Payer: Self-pay

## 2020-06-08 DIAGNOSIS — Z1231 Encounter for screening mammogram for malignant neoplasm of breast: Secondary | ICD-10-CM | POA: Insufficient documentation

## 2020-06-12 ENCOUNTER — Other Ambulatory Visit: Payer: Self-pay | Admitting: Internal Medicine

## 2020-06-12 DIAGNOSIS — N6489 Other specified disorders of breast: Secondary | ICD-10-CM

## 2020-06-12 DIAGNOSIS — R921 Mammographic calcification found on diagnostic imaging of breast: Secondary | ICD-10-CM

## 2020-06-12 DIAGNOSIS — R928 Other abnormal and inconclusive findings on diagnostic imaging of breast: Secondary | ICD-10-CM

## 2020-06-26 ENCOUNTER — Ambulatory Visit
Admission: RE | Admit: 2020-06-26 | Discharge: 2020-06-26 | Disposition: A | Discharge status: Home / Self Care | Payer: Medicare HMO | Source: Ambulatory Visit | Attending: Internal Medicine | Admitting: Internal Medicine

## 2020-06-26 ENCOUNTER — Other Ambulatory Visit: Payer: Self-pay

## 2020-06-26 DIAGNOSIS — N6489 Other specified disorders of breast: Secondary | ICD-10-CM | POA: Diagnosis present

## 2020-06-26 DIAGNOSIS — R928 Other abnormal and inconclusive findings on diagnostic imaging of breast: Secondary | ICD-10-CM

## 2020-06-26 DIAGNOSIS — R921 Mammographic calcification found on diagnostic imaging of breast: Secondary | ICD-10-CM

## 2020-06-30 ENCOUNTER — Other Ambulatory Visit: Payer: Self-pay | Admitting: Internal Medicine

## 2020-06-30 DIAGNOSIS — R928 Other abnormal and inconclusive findings on diagnostic imaging of breast: Secondary | ICD-10-CM

## 2020-06-30 DIAGNOSIS — R921 Mammographic calcification found on diagnostic imaging of breast: Secondary | ICD-10-CM

## 2020-07-01 ENCOUNTER — Other Ambulatory Visit: Payer: Self-pay | Admitting: Nephrology

## 2020-07-01 DIAGNOSIS — N1832 Chronic kidney disease, stage 3b: Secondary | ICD-10-CM

## 2020-07-01 DIAGNOSIS — I10 Essential (primary) hypertension: Secondary | ICD-10-CM

## 2020-07-07 ENCOUNTER — Ambulatory Visit
Admission: RE | Admit: 2020-07-07 | Discharge: 2020-07-07 | Disposition: A | Discharge status: Home / Self Care | Payer: Medicare HMO | Source: Ambulatory Visit | Attending: Internal Medicine | Admitting: Internal Medicine

## 2020-07-07 ENCOUNTER — Other Ambulatory Visit: Payer: Self-pay

## 2020-07-07 DIAGNOSIS — R928 Other abnormal and inconclusive findings on diagnostic imaging of breast: Secondary | ICD-10-CM

## 2020-07-07 DIAGNOSIS — R921 Mammographic calcification found on diagnostic imaging of breast: Secondary | ICD-10-CM | POA: Insufficient documentation

## 2020-07-07 HISTORY — PX: BREAST BIOPSY: SHX20

## 2020-07-08 LAB — SURGICAL PATHOLOGY

## 2020-07-09 ENCOUNTER — Telehealth: Payer: Self-pay

## 2020-07-09 NOTE — Telephone Encounter (Signed)
Julie Davis has an appt with Dr. Maia Plan on 07/14/2020 at 1:30.  I have left a message with her daughter [name] with time and date.

## 2020-07-13 ENCOUNTER — Ambulatory Visit: Payer: Self-pay | Admitting: General Surgery

## 2020-07-13 NOTE — H&P (View-Only) (Signed)
PATIENT PROFILE: Julie Davis is a 74 y.o. female who presents to the Clinic for consultation at the request of Dr. Hyacinth Meeker for evaluation of abnormal mammogram discordant to core biopsy.  PCP:  Carlynn Purl, MD  HISTORY OF PRESENT ILLNESS: [name] reports she had her usual screening mammogram.  The screening mammogram shows new calcification on the right breast.  This led to diagnostic mammogram to confirm the suspicious calcifications.  Stereotactic core needle biopsy was done.  This showed fibrocystic changes.  This was assessed discordant as per radiologist.  I agree with radiologist.  I personally evaluated the images of the screening mammogram and the diagnostic mammogram.  Family history of breast cancer: Father with breast cancer Family history of other cancers: None Menarche: Between 89 to 67 years old Menopause: In her mid 32s Used OCP: Unknown Used estrogen and progesterone therapy: Yes for many years History of Radiation to the chest: None Number of pregnancies: 1 Age of first pregnancy: 20 Previous breast biopsy: None   PROBLEM LIST:         Problem List  Date Reviewed: 05/21/2020         Noted   Major depressive disorder, recurrent, mild (CMS-HCC) 10/02/2019   B12 deficiency 10/02/2019   Overview    229, 3/21      Vitamin D deficiency 04/04/2019   Overview    18, 9/20 36, 3/21      Irritable bowel syndrome with constipation and diarrhea 04/18/2018   Mild dementia (CMS-HCC) 02/20/2018   Alzheimer's dementia without behavioral disturbance (CMS-HCC) 08/17/2017   Age-related osteoporosis without current pathological fracture 04/05/2017   Overview    9/18 BD osteoporosis, Fosamax initiated  9/20 BD osteoporosis, moderate improvement, vitamin D 36 3/21      Medicare annual wellness visit, initial 03/29/2017   Overview    9/18, 9/19, 9/20, 9/21      Benign essential hypertension 03/22/2016   Hyperlipidemia, mixed 03/22/2016    History of Clostridium difficile colitis 10/07/2014   GERD (gastroesophageal reflux disease) Unknown   Overview    Gastritis EGD 11/19      Lumbar stenosis 10/29/2013      GENERAL REVIEW OF SYSTEMS:   General ROS: negative for - chills, fatigue, fever, weight gain or weight loss Allergy and Immunology ROS: negative for - hives  Hematological and Lymphatic ROS: negative for - bleeding problems or bruising, negative for palpable nodes Endocrine ROS: negative for - heat or cold intolerance, hair changes Respiratory ROS: negative for - cough, shortness of breath or wheezing Cardiovascular ROS: no chest pain or palpitations GI ROS: negative for nausea, vomiting, abdominal pain, diarrhea, constipation Musculoskeletal ROS: negative for - joint swelling or muscle pain Neurological ROS: negative for - confusion, syncope Dermatological ROS: negative for pruritus and rash Psychiatric: negative for anxiety, depression, difficulty sleeping and memory loss  MEDICATIONS: Current Medications        Current Outpatient Medications  Medication Sig Dispense Refill  . alendronate (FOSAMAX) 70 MG tablet TAKE 1 TABLET BY MOUTH EVERY 7 DAYS WITH A FULL GLASS OF WATER, DO NOT LIE DOWN FOR 30 MINUTES 12 tablet 3  . ALPRAZolam (XANAX) 0.5 MG tablet TAKE 1 TABLET BY MOUTH TWICE A DAY AS NEEDED 60 tablet 4  . amLODIPine (NORVASC) 10 MG tablet TAKE 1 TABLET (10 MG TOTAL) BY MOUTH ONCE DAILY FOR 90 DAYS 90 tablet 1  . atorvastatin (LIPITOR) 10 MG tablet TAKE 1 TABLET BY MOUTH EVERY DAY 90 tablet 1  .  cyanocobalamin (VITAMIN B12) 1000 MCG tablet Take 1,000 mcg by mouth once daily    . diclofenac (VOLTAREN) 1 % topical gel APPLY 2 G TOPICALLY 3 (THREE) TIMES DAILY 100 g 11  . donepeziL (ARICEPT) 10 MG tablet Take 1 tablet (10 mg total) by mouth once daily 90 tablet 1  . ergocalciferol, vitamin D2, 1,250 mcg (50,000 unit) capsule TAKE 1 CAPSULE (50,000 UNITS TOTAL) BY MOUTH ONCE A WEEK 12 capsule 2   . escitalopram oxalate (LEXAPRO) 10 MG tablet Take 1 tablet (10 mg total) by mouth once daily 90 tablet 1  . pantoprazole (PROTONIX) 40 MG DR tablet TAKE 1 TABLET BY MOUTH EVERY DAY 90 tablet 3  . phytonadione, vit K1, (VITAMIN K) 100 mcg tablet Take 100 mcg by mouth once daily    . traMADoL (ULTRAM) 50 mg tablet TAKE 2 TABLETS BY MOUTH 3 TIMES A DAY AS NEEDED FOR PAIN 180 tablet 5   No current facility-administered medications for this visit.      ALLERGIES: Methotrexate  PAST MEDICAL HISTORY:     Past Medical History:  Diagnosis Date  . Abdominal pain, LLQ (left lower quadrant) 04/18/2018  . Abdominal pain, RUQ (right upper quadrant) 04/18/2018  . Allergy-induced asthma, unspecified   . GERD (gastroesophageal reflux disease)   . Hyperlipidemia   . Hypertension   . IBS (irritable bowel syndrome)   . Irritable bowel syndrome with constipation and diarrhea 04/18/2018  . OA (osteoarthritis)    lumbar spinal stenosis, hands  . Rheumatoid arthritis (CMS-HCC)    mild elevation in anti-CCP antibody, Plaquenil, family hx  . 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 Sleep apnea   . Thoracic or lumbosacral neuritis or radiculitis, unspecified     PAST SURGICAL HISTORY:      Past Surgical History:  Procedure Laterality Date  . CATARACT EXTRACTION Bilateral   . CHOLECYSTECTOMY    . COLONOSCOPY  06/15/2005   F/U Colitis  . COLONOSCOPY  03/16/2010   F/U Colitis: CBF 02/2015; Recall Ltr mailed 01/09/2015 (dw)  . COLONOSCOPY  06/25/2018   F/U Colitis: CBF 06/2023  . EGD  06/25/2018   Gastritis, Duodenitis: No repeat per RTE  . EGD  06/25/2018   Gastritis; Duodenitis: No repeat per RTE  . LAMINECTOMY POSTERIOR LUMBAR FACETECTOMY & FORAMINOTOMY W/DECOMP Left 11/07/2013   Procedure: LAMINECTOMY POSTERIOR LUMBAR FACETECTOMY & FORAMINOTOMY W/DECOMP-----L4-5 DECOMPRESSION LAM WITH LEFT FORAMINOTOMY ;   Surgeon: Langston Reusing, MD;  Location: [facility] OR;  Service: Neurosurgery;  Laterality: Left;  . SIGMOIDOSCOPY FLEXIBLE  05/27/2005     FAMILY HISTORY:      Family History  Problem Relation Age of Onset  . Heart failure Mother   . Osteoporosis (Thinning of bones) Mother   . Coronary Artery Disease (Blocked arteries around heart) Father   . High blood pressure (Hypertension) Father      SOCIAL HISTORY: Social History          Socioeconomic History  . Marital status: Married    Spouse name: Not on file  . Number of children: Not on file  . Years of education: Not on file  . Highest education level: Not on file  Occupational History  . Not on file  Tobacco Use  . Smoking status: Former Games developer  . Smokeless tobacco: Never Used  Vaping Use  . Vaping Use: Never used  Substance and Sexual Activity  . Alcohol use: No  . Drug use: No  . Sexual  activity: Not on file  Other Topics Concern  . Not on file  Social History Narrative  . Not on file   Social Determinants of Health   Financial Resource Strain: Not on file  Food Insecurity: Not on file  Transportation Needs: Not on file      PHYSICAL EXAM:    Vitals:   07/13/20 1402  BP: 154/71  Pulse: 65   Body mass index is 26.61 kg/m. Weight: 70.3 kg (155 lb)   GENERAL: Alert, active, oriented x3  HEENT: Pupils equal reactive to light. Extraocular movements are intact. Sclera clear. Palpebral conjunctiva normal red color.Pharynx clear.  NECK: Supple with no palpable mass and no adenopathy.  LUNGS: Sound clear with no rales rhonchi or wheezes.  HEART: Regular rhythm S1 and S2 without murmur.  BREAST: breasts appear normal, no suspicious masses, no skin or nipple changes or axillary nodes.  ABDOMEN: Soft and depressible, nontender with no palpable mass, no hepatomegaly.  EXTREMITIES: Well-developed well-nourished symmetrical with no dependent edema.  NEUROLOGICAL: Awake alert  oriented, facial expression symmetrical, moving all extremities.  REVIEW OF DATA: I have reviewed the following data today:      Initial consult on 07/13/2020  Component Date Value  . WBC (White Blood Cell Co* 07/13/2020 8.7   . RBC (Red Blood Cell Coun* 07/13/2020 3.91*  . Hemoglobin 07/13/2020 12.5   . Hematocrit 07/13/2020 37.8   . MCV (Mean Corpuscular Vo* 07/13/2020 96.7   . MCH (Mean Corpuscular He* 07/13/2020 32.0*  . MCHC (Mean Corpuscular H* 07/13/2020 33.1   . Platelet Count 07/13/2020 272   . RDW-CV (Red Cell Distrib* 07/13/2020 11.8   . MPV (Mean Platelet Volum* 07/13/2020 11.3   . Neutrophils 07/13/2020 4.32   . Lymphocytes 07/13/2020 3.53   . Monocytes 07/13/2020 0.64   . Eosinophils 07/13/2020 0.10   . Basophils 07/13/2020 0.07   . Neutrophil % 07/13/2020 49.8   . Lymphocyte % 07/13/2020 40.7   . Monocyte % 07/13/2020 7.4   . Eosinophil % 07/13/2020 1.2   . Basophil% 07/13/2020 0.8   . Immature Granulocyte % 07/13/2020 0.1   . Immature Granulocyte Cou* 07/13/2020 0.01   Ancillary Orders on 06/01/2020  Component Date Value  . Creatinine, Random Urine 06/01/2020 66.8   . Urine Albumin, Random 06/01/2020 1,224   . Urine Albumin/Creatinine* 06/01/2020 1,832.3*  Appointment on 05/05/2020  Component Date Value  . Total IgG - [facility] 05/05/2020 917   . Immunoglobulin A, Qn, Se* 05/05/2020 315   . IgM - [facility] 05/05/2020 72   . Protein Total - [facility] 05/05/2020 6.8   . Albumin - [facility] 05/05/2020 3.5   . Alpha-1-Globulin - [facility] 05/05/2020 0.3   . Alpha-2-Globulin - [facility] 05/05/2020 1.0   . Beta Globulin - [facility] 05/05/2020 1.1   . Gamma Globulin - [facility] 05/05/2020 0.9   . M-Spike - [facility] 05/05/2020 Not Observed   . Globulin, Total - [facility] 05/05/2020 3.3   . A/G Ratio - [facility] 05/05/2020 1.1   . IFE 1 - [facility] 05/05/2020 Comment   . Please note: - [facility] 05/05/2020 Comment   . Protein, Ur - [facility] 05/05/2020 547.2   . Albumin ELP, Urine -  Lab* 05/05/2020 TNP   . Alpha-1-Globulin, Urine * 05/05/2020 TNP   . Alpha-2-Globulin, Urine * 05/05/2020 TNP   . Beta-Globulin - [facility] 05/05/2020 TNP   . Gamma Globulin, U - [facility] 05/05/2020 TNP   . M-Spike, % - [facility] 05/05/2020 TNP   . Please note: -  [facility] 05/05/2020 Comment      ASSESSMENT: Ms. Stuckey is a 74 y.o. female presenting for consultation for discordant mammogram and core biopsy.    Patient was oriented again about the pathology results.  Due to the findings of hyperacusis changes on a concerning area of calcification this was assessed as discordant.  Patient was oriented about the recommendation of excisional biopsy to rule out malignancy. Surgical technique and post operative care was discussed with patient. Risk of surgery was discussed with patient including but not limited to: wound infection, seroma, hematoma, brachial plexopathy, mondor's disease (thrombosis of small veins of breast), chronic wound pain, breast lymphedema, altered sensation to the nipple and cosmesis among others.   Abnormal finding on mammography [R92.8]  PLAN: 1. Radiofrequency tag needle guided excisional biopsy of right breast (19125) 2. CBC, CMP 3. Avoid taking aspirin 5 days before surgery 4. Contact us if you have any concern.   Patient and her daughter verbalized understanding, all questions were answered, and were agreeable with the plan outlined above.    Carolan Shiver, MD  Electronically signed by Carolan Shiver, MD

## 2020-07-13 NOTE — H&P (Signed)
PATIENT PROFILE: Julie Davis is a 74 y.o. female who presents to the Clinic for consultation at the request of Dr. Hyacinth Meeker for evaluation of abnormal mammogram discordant to core biopsy.  PCP:  Carlynn Purl, MD  HISTORY OF PRESENT ILLNESS: [name] reports she had her usual screening mammogram.  The screening mammogram shows new calcification on the right breast.  This led to diagnostic mammogram to confirm the suspicious calcifications.  Stereotactic core needle biopsy was done.  This showed fibrocystic changes.  This was assessed discordant as per radiologist.  I agree with radiologist.  I personally evaluated the images of the screening mammogram and the diagnostic mammogram.  Family history of breast cancer: Father with breast cancer Family history of other cancers: None Menarche: Between 89 to 67 years old Menopause: In her mid 32s Used OCP: Unknown Used estrogen and progesterone therapy: Yes for many years History of Radiation to the chest: None Number of pregnancies: 1 Age of first pregnancy: 20 Previous breast biopsy: None   PROBLEM LIST:         Problem List  Date Reviewed: 05/21/2020         Noted   Major depressive disorder, recurrent, mild (CMS-HCC) 10/02/2019   B12 deficiency 10/02/2019   Overview    229, 3/21      Vitamin D deficiency 04/04/2019   Overview    18, 9/20 36, 3/21      Irritable bowel syndrome with constipation and diarrhea 04/18/2018   Mild dementia (CMS-HCC) 02/20/2018   Alzheimer's dementia without behavioral disturbance (CMS-HCC) 08/17/2017   Age-related osteoporosis without current pathological fracture 04/05/2017   Overview    9/18 BD osteoporosis, Fosamax initiated  9/20 BD osteoporosis, moderate improvement, vitamin D 36 3/21      Medicare annual wellness visit, initial 03/29/2017   Overview    9/18, 9/19, 9/20, 9/21      Benign essential hypertension 03/22/2016   Hyperlipidemia, mixed 03/22/2016    History of Clostridium difficile colitis 10/07/2014   GERD (gastroesophageal reflux disease) Unknown   Overview    Gastritis EGD 11/19      Lumbar stenosis 10/29/2013      GENERAL REVIEW OF SYSTEMS:   General ROS: negative for - chills, fatigue, fever, weight gain or weight loss Allergy and Immunology ROS: negative for - hives  Hematological and Lymphatic ROS: negative for - bleeding problems or bruising, negative for palpable nodes Endocrine ROS: negative for - heat or cold intolerance, hair changes Respiratory ROS: negative for - cough, shortness of breath or wheezing Cardiovascular ROS: no chest pain or palpitations GI ROS: negative for nausea, vomiting, abdominal pain, diarrhea, constipation Musculoskeletal ROS: negative for - joint swelling or muscle pain Neurological ROS: negative for - confusion, syncope Dermatological ROS: negative for pruritus and rash Psychiatric: negative for anxiety, depression, difficulty sleeping and memory loss  MEDICATIONS: Current Medications        Current Outpatient Medications  Medication Sig Dispense Refill  . alendronate (FOSAMAX) 70 MG tablet TAKE 1 TABLET BY MOUTH EVERY 7 DAYS WITH A FULL GLASS OF WATER, DO NOT LIE DOWN FOR 30 MINUTES 12 tablet 3  . ALPRAZolam (XANAX) 0.5 MG tablet TAKE 1 TABLET BY MOUTH TWICE A DAY AS NEEDED 60 tablet 4  . amLODIPine (NORVASC) 10 MG tablet TAKE 1 TABLET (10 MG TOTAL) BY MOUTH ONCE DAILY FOR 90 DAYS 90 tablet 1  . atorvastatin (LIPITOR) 10 MG tablet TAKE 1 TABLET BY MOUTH EVERY DAY 90 tablet 1  .  cyanocobalamin (VITAMIN B12) 1000 MCG tablet Take 1,000 mcg by mouth once daily    . diclofenac (VOLTAREN) 1 % topical gel APPLY 2 G TOPICALLY 3 (THREE) TIMES DAILY 100 g 11  . donepeziL (ARICEPT) 10 MG tablet Take 1 tablet (10 mg total) by mouth once daily 90 tablet 1  . ergocalciferol, vitamin D2, 1,250 mcg (50,000 unit) capsule TAKE 1 CAPSULE (50,000 UNITS TOTAL) BY MOUTH ONCE A WEEK 12 capsule 2   . escitalopram oxalate (LEXAPRO) 10 MG tablet Take 1 tablet (10 mg total) by mouth once daily 90 tablet 1  . pantoprazole (PROTONIX) 40 MG DR tablet TAKE 1 TABLET BY MOUTH EVERY DAY 90 tablet 3  . phytonadione, vit K1, (VITAMIN K) 100 mcg tablet Take 100 mcg by mouth once daily    . traMADoL (ULTRAM) 50 mg tablet TAKE 2 TABLETS BY MOUTH 3 TIMES A DAY AS NEEDED FOR PAIN 180 tablet 5   No current facility-administered medications for this visit.      ALLERGIES: Methotrexate  PAST MEDICAL HISTORY:     Past Medical History:  Diagnosis Date  . Abdominal pain, LLQ (left lower quadrant) 04/18/2018  . Abdominal pain, RUQ (right upper quadrant) 04/18/2018  . Allergy-induced asthma, unspecified   . GERD (gastroesophageal reflux disease)   . Hyperlipidemia   . Hypertension   . IBS (irritable bowel syndrome)   . Irritable bowel syndrome with constipation and diarrhea 04/18/2018  . OA (osteoarthritis)    lumbar spinal stenosis, hands  . Rheumatoid arthritis (CMS-HCC)    mild elevation in anti-CCP antibody, Plaquenil, family hx  . 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 Sleep apnea   . Thoracic or lumbosacral neuritis or radiculitis, unspecified     PAST SURGICAL HISTORY:      Past Surgical History:  Procedure Laterality Date  . CATARACT EXTRACTION Bilateral   . CHOLECYSTECTOMY    . COLONOSCOPY  06/15/2005   F/U Colitis  . COLONOSCOPY  03/16/2010   F/U Colitis: CBF 02/2015; Recall Ltr mailed 01/09/2015 (dw)  . COLONOSCOPY  06/25/2018   F/U Colitis: CBF 06/2023  . EGD  06/25/2018   Gastritis, Duodenitis: No repeat per RTE  . EGD  06/25/2018   Gastritis; Duodenitis: No repeat per RTE  . LAMINECTOMY POSTERIOR LUMBAR FACETECTOMY & FORAMINOTOMY W/DECOMP Left 11/07/2013   Procedure: LAMINECTOMY POSTERIOR LUMBAR FACETECTOMY & FORAMINOTOMY W/DECOMP-----L4-5 DECOMPRESSION LAM WITH LEFT FORAMINOTOMY ;   Surgeon: Langston Reusing, MD;  Location: [facility] OR;  Service: Neurosurgery;  Laterality: Left;  . SIGMOIDOSCOPY FLEXIBLE  05/27/2005     FAMILY HISTORY:      Family History  Problem Relation Age of Onset  . Heart failure Mother   . Osteoporosis (Thinning of bones) Mother   . Coronary Artery Disease (Blocked arteries around heart) Father   . High blood pressure (Hypertension) Father      SOCIAL HISTORY: Social History          Socioeconomic History  . Marital status: Married    Spouse name: Not on file  . Number of children: Not on file  . Years of education: Not on file  . Highest education level: Not on file  Occupational History  . Not on file  Tobacco Use  . Smoking status: Former Games developer  . Smokeless tobacco: Never Used  Vaping Use  . Vaping Use: Never used  Substance and Sexual Activity  . Alcohol use: No  . Drug use: No  . Sexual  activity: Not on file  Other Topics Concern  . Not on file  Social History Narrative  . Not on file   Social Determinants of Health   Financial Resource Strain: Not on file  Food Insecurity: Not on file  Transportation Needs: Not on file      PHYSICAL EXAM:    Vitals:   07/13/20 1402  BP: 154/71  Pulse: 65   Body mass index is 26.61 kg/m. Weight: 70.3 kg (155 lb)   GENERAL: Alert, active, oriented x3  HEENT: Pupils equal reactive to light. Extraocular movements are intact. Sclera clear. Palpebral conjunctiva normal red color.Pharynx clear.  NECK: Supple with no palpable mass and no adenopathy.  LUNGS: Sound clear with no rales rhonchi or wheezes.  HEART: Regular rhythm S1 and S2 without murmur.  BREAST: breasts appear normal, no suspicious masses, no skin or nipple changes or axillary nodes.  ABDOMEN: Soft and depressible, nontender with no palpable mass, no hepatomegaly.  EXTREMITIES: Well-developed well-nourished symmetrical with no dependent edema.  NEUROLOGICAL: Awake alert  oriented, facial expression symmetrical, moving all extremities.  REVIEW OF DATA: I have reviewed the following data today:      Initial consult on 07/13/2020  Component Date Value  . WBC (White Blood Cell Co* 07/13/2020 8.7   . RBC (Red Blood Cell Coun* 07/13/2020 3.91*  . Hemoglobin 07/13/2020 12.5   . Hematocrit 07/13/2020 37.8   . MCV (Mean Corpuscular Vo* 07/13/2020 96.7   . MCH (Mean Corpuscular He* 07/13/2020 32.0*  . MCHC (Mean Corpuscular H* 07/13/2020 33.1   . Platelet Count 07/13/2020 272   . RDW-CV (Red Cell Distrib* 07/13/2020 11.8   . MPV (Mean Platelet Volum* 07/13/2020 11.3   . Neutrophils 07/13/2020 4.32   . Lymphocytes 07/13/2020 3.53   . Monocytes 07/13/2020 0.64   . Eosinophils 07/13/2020 0.10   . Basophils 07/13/2020 0.07   . Neutrophil % 07/13/2020 49.8   . Lymphocyte % 07/13/2020 40.7   . Monocyte % 07/13/2020 7.4   . Eosinophil % 07/13/2020 1.2   . Basophil% 07/13/2020 0.8   . Immature Granulocyte % 07/13/2020 0.1   . Immature Granulocyte Cou* 07/13/2020 0.01   Ancillary Orders on 06/01/2020  Component Date Value  . Creatinine, Random Urine 06/01/2020 66.8   . Urine Albumin, Random 06/01/2020 1,224   . Urine Albumin/Creatinine* 06/01/2020 1,832.3*  Appointment on 05/05/2020  Component Date Value  . Total IgG - [facility] 05/05/2020 917   . Immunoglobulin A, Qn, Se* 05/05/2020 315   . IgM - [facility] 05/05/2020 72   . Protein Total - [facility] 05/05/2020 6.8   . Albumin - [facility] 05/05/2020 3.5   . Alpha-1-Globulin - [facility] 05/05/2020 0.3   . Alpha-2-Globulin - [facility] 05/05/2020 1.0   . Beta Globulin - [facility] 05/05/2020 1.1   . Gamma Globulin - [facility] 05/05/2020 0.9   . M-Spike - [facility] 05/05/2020 Not Observed   . Globulin, Total - [facility] 05/05/2020 3.3   . A/G Ratio - [facility] 05/05/2020 1.1   . IFE 1 - [facility] 05/05/2020 Comment   . Please note: - [facility] 05/05/2020 Comment   . Protein, Ur - [facility] 05/05/2020 547.2   . Albumin ELP, Urine -  Lab* 05/05/2020 TNP   . Alpha-1-Globulin, Urine * 05/05/2020 TNP   . Alpha-2-Globulin, Urine * 05/05/2020 TNP   . Beta-Globulin - [facility] 05/05/2020 TNP   . Gamma Globulin, U - [facility] 05/05/2020 TNP   . M-Spike, % - [facility] 05/05/2020 TNP   . Please note: -   [facility] 05/05/2020 Comment      ASSESSMENT: Ms. Stuckey is a 74 y.o. female presenting for consultation for discordant mammogram and core biopsy.    Patient was oriented again about the pathology results.  Due to the findings of hyperacusis changes on a concerning area of calcification this was assessed as discordant.  Patient was oriented about the recommendation of excisional biopsy to rule out malignancy. Surgical technique and post operative care was discussed with patient. Risk of surgery was discussed with patient including but not limited to: wound infection, seroma, hematoma, brachial plexopathy, mondor's disease (thrombosis of small veins of breast), chronic wound pain, breast lymphedema, altered sensation to the nipple and cosmesis among others.   Abnormal finding on mammography [R92.8]  PLAN: 1. Radiofrequency tag needle guided excisional biopsy of right breast (19125) 2. CBC, CMP 3. Avoid taking aspirin 5 days before surgery 4. Contact us if you have any concern.   Patient and her daughter verbalized understanding, all questions were answered, and were agreeable with the plan outlined above.    Carolan Shiver, MD  Electronically signed by Carolan Shiver, MD

## 2020-07-14 ENCOUNTER — Other Ambulatory Visit: Payer: Self-pay | Admitting: General Surgery

## 2020-07-14 DIAGNOSIS — R921 Mammographic calcification found on diagnostic imaging of breast: Secondary | ICD-10-CM

## 2020-07-27 ENCOUNTER — Encounter
Admission: RE | Admit: 2020-07-27 | Discharge: 2020-07-27 | Disposition: A | Discharge status: Home / Self Care | Payer: Medicare HMO | Source: Ambulatory Visit | Attending: General Surgery | Admitting: General Surgery

## 2020-07-27 ENCOUNTER — Other Ambulatory Visit: Payer: Self-pay

## 2020-07-27 NOTE — Patient Instructions (Signed)
Your procedure is scheduled on: Wednesday 08/05/20.  Report to THE FIRST FLOOR REGISTRATION DESK IN THE MEDICAL MALL ON THE MORNING OF SURGERY FIRST, THEN YOU WILL CHECK IN AT THE SURGERY INFORMATION DESK LOCATED OUTSIDE THE SAME DAY SURGERY DEPARTMENT LOCATED ON 2ND FLOOR MEDICAL MALL ENTRANCE.  To find out your arrival time please call [phone] between 1PM - 3PM on Tuesday 08/04/20.   Remember: Instructions that are not followed completely may result in serious medical risk, up to and including death, or upon the discretion of your surgeon and anesthesiologist your surgery may need to be rescheduled.     __X__ 1. Do not eat food after midnight the night before your procedure.                 No gum chewing or hard candies. You may drink clear liquids up to 2 hours                 before you are scheduled to arrive for your surgery- DO NOT drink clear                 liquids within 2 hours of the start of your surgery.                 Clear Liquids include:  water, apple juice without pulp, clear carbohydrate                 drink such as Clearfast or Gatorade, Black Coffee or Tea (Do not add                 milk or creamer to coffee or tea).  __X__2.  On the morning of surgery brush your teeth with toothpaste and water, you may rinse your mouth with mouthwash if you wish.  Do not swallow any toothpaste or mouthwash.    __X__ 3.  No Alcohol for 24 hours before or after surgery.  __X__ 4.  Do Not Smoke or use e-cigarettes For 24 Hours Prior to Your Surgery.                 Do not use any chewable tobacco products for at least 6 hours prior to                 surgery.  __X__5.  Notify your doctor if there is any change in your medical condition      (cold, fever, infections).      Do NOT wear jewelry, make-up, hairpins, clips or nail polish. Do NOT wear lotions, powders, or perfumes.  Do NOT shave 48 hours prior to surgery. Men may shave face and neck. Do NOT bring valuables to  the hospital.     [facility] is not responsible for any belongings or valuables.   Contacts, dentures/partials or body piercings may not be worn into surgery. Bring a case for your contacts, glasses or hearing aids, a denture cup will be supplied.    Patients discharged the day of surgery will not be allowed to drive home.     __X__ Take these medicines the morning of surgery with A SIP OF WATER:     1. pantoprazole (PROTONIX)   2. amLODipine (NORVASC)  3. ALPRAZolam Prudy Feeler) if needed  4. traMADol (ULTRAM) if needed     __X__ Use CHG Soap as directed.  __X__ Stop Anti-inflammatories 7 days before surgery such as Advil, Ibuprofen, Motrin, BC or Goodies Powder, Naprosyn, Naproxen, Aleve, Aspirin, Meloxicam. May take Tylenol  if needed for pain or discomfort.   __X__Do not start taking any new herbal supplements or vitamins prior to your procedure.    Wear comfortable clothing (specific to your surgery type) to the hospital.  Plan for stool softeners for home use; pain medications have a tendency to cause constipation. You can also help prevent constipation by eating foods high in fiber such as fruits and vegetables and drinking plenty of fluids as your diet allows.  After surgery, you can prevent lung complications by doing breathing exercises.Take deep breaths and cough every 1-2 hours. Your doctor may order a device called an Incentive Spirometer to help you take deep breaths.  Please call the Pre-Admissions Testing Department at [phone] if you have any questions about these instructions

## 2020-07-29 ENCOUNTER — Ambulatory Visit
Admission: RE | Admit: 2020-07-29 | Discharge: 2020-07-29 | Disposition: A | Discharge status: Home / Self Care | Payer: Medicare HMO | Source: Ambulatory Visit | Attending: General Surgery | Admitting: General Surgery

## 2020-07-29 ENCOUNTER — Other Ambulatory Visit: Payer: Self-pay

## 2020-07-29 ENCOUNTER — Encounter
Admission: RE | Admit: 2020-07-29 | Discharge: 2020-07-29 | Disposition: A | Discharge status: Home / Self Care | Payer: Medicare HMO | Source: Ambulatory Visit | Attending: General Surgery | Admitting: General Surgery

## 2020-07-29 DIAGNOSIS — R921 Mammographic calcification found on diagnostic imaging of breast: Secondary | ICD-10-CM | POA: Insufficient documentation

## 2020-07-29 DIAGNOSIS — I10 Essential (primary) hypertension: Secondary | ICD-10-CM | POA: Insufficient documentation

## 2020-08-03 ENCOUNTER — Other Ambulatory Visit: Payer: Self-pay

## 2020-08-03 ENCOUNTER — Other Ambulatory Visit
Admission: RE | Admit: 2020-08-03 | Discharge: 2020-08-03 | Disposition: A | Discharge status: Home / Self Care | Payer: Medicare HMO | Source: Ambulatory Visit | Attending: General Surgery | Admitting: General Surgery

## 2020-08-03 DIAGNOSIS — Z01812 Encounter for preprocedural laboratory examination: Secondary | ICD-10-CM | POA: Insufficient documentation

## 2020-08-03 DIAGNOSIS — Z20822 Contact with and (suspected) exposure to covid-19: Secondary | ICD-10-CM | POA: Diagnosis not present

## 2020-08-03 LAB — SARS CORONAVIRUS 2 (TAT 6-24 HRS): SARS Coronavirus 2: NEGATIVE

## 2020-08-05 ENCOUNTER — Other Ambulatory Visit: Payer: Self-pay

## 2020-08-05 ENCOUNTER — Ambulatory Visit: Payer: Medicare HMO | Admitting: Certified Registered Nurse Anesthetist

## 2020-08-05 ENCOUNTER — Encounter: Payer: Self-pay | Admitting: General Surgery

## 2020-08-05 ENCOUNTER — Encounter
Admission: RE | Disposition: A | Discharge status: Home / Self Care | Payer: Self-pay | Source: Home / Self Care | Attending: General Surgery

## 2020-08-05 ENCOUNTER — Ambulatory Visit
Admission: RE | Admit: 2020-08-05 | Discharge: 2020-08-05 | Disposition: A | Discharge status: Home / Self Care | Payer: Medicare HMO | Source: Ambulatory Visit | Attending: General Surgery | Admitting: General Surgery

## 2020-08-05 ENCOUNTER — Ambulatory Visit
Admission: RE | Admit: 2020-08-05 | Discharge: 2020-08-05 | Disposition: A | Discharge status: Home / Self Care | Payer: Medicare HMO | Attending: General Surgery | Admitting: General Surgery

## 2020-08-05 DIAGNOSIS — Z888 Allergy status to other drugs, medicaments and biological substances status: Secondary | ICD-10-CM | POA: Diagnosis not present

## 2020-08-05 DIAGNOSIS — R920 Mammographic microcalcification found on diagnostic imaging of breast: Secondary | ICD-10-CM | POA: Diagnosis present

## 2020-08-05 DIAGNOSIS — Z8249 Family history of ischemic heart disease and other diseases of the circulatory system: Secondary | ICD-10-CM | POA: Insufficient documentation

## 2020-08-05 DIAGNOSIS — Z803 Family history of malignant neoplasm of breast: Secondary | ICD-10-CM | POA: Diagnosis not present

## 2020-08-05 DIAGNOSIS — Z79899 Other long term (current) drug therapy: Secondary | ICD-10-CM | POA: Insufficient documentation

## 2020-08-05 DIAGNOSIS — G309 Alzheimer's disease, unspecified: Secondary | ICD-10-CM | POA: Insufficient documentation

## 2020-08-05 DIAGNOSIS — Z8262 Family history of osteoporosis: Secondary | ICD-10-CM | POA: Diagnosis not present

## 2020-08-05 DIAGNOSIS — F0280 Dementia in other diseases classified elsewhere without behavioral disturbance: Secondary | ICD-10-CM | POA: Insufficient documentation

## 2020-08-05 DIAGNOSIS — M069 Rheumatoid arthritis, unspecified: Secondary | ICD-10-CM | POA: Insufficient documentation

## 2020-08-05 DIAGNOSIS — Z791 Long term (current) use of non-steroidal anti-inflammatories (NSAID): Secondary | ICD-10-CM | POA: Diagnosis not present

## 2020-08-05 DIAGNOSIS — R921 Mammographic calcification found on diagnostic imaging of breast: Secondary | ICD-10-CM | POA: Diagnosis not present

## 2020-08-05 DIAGNOSIS — Z87891 Personal history of nicotine dependence: Secondary | ICD-10-CM | POA: Diagnosis not present

## 2020-08-05 HISTORY — PX: BREAST BIOPSY WITH RADIO FREQUENCY LOCALIZER: SHX6895

## 2020-08-05 HISTORY — PX: BREAST EXCISIONAL BIOPSY: SUR124

## 2020-08-05 SURGERY — BREAST BIOPSY WITH RADIO FREQUENCY LOCALIZER
Anesthesia: General | Site: Breast | Laterality: Right

## 2020-08-05 MED ORDER — LACTATED RINGERS IV SOLN: INTRAVENOUS | Status: DC

## 2020-08-05 MED ORDER — ORAL CARE MOUTH RINSE: 15.0000 mL | Freq: Once | OROMUCOSAL | Status: AC

## 2020-08-05 MED ORDER — PROPOFOL 10 MG/ML IV BOLUS
INTRAVENOUS | Status: AC
  Filled 2020-08-05: qty 20

## 2020-08-05 MED ORDER — EPINEPHRINE PF 1 MG/ML IJ SOLN
INTRAMUSCULAR | Status: AC
  Filled 2020-08-05: qty 1

## 2020-08-05 MED ORDER — BUPIVACAINE HCL (PF) 0.5 % IJ SOLN
INTRAMUSCULAR | Status: AC
  Filled 2020-08-05: qty 30

## 2020-08-05 MED ORDER — PROPOFOL 10 MG/ML IV BOLUS
INTRAVENOUS | Status: DC | PRN
Start: 2020-08-05 — End: 2020-08-05
  Administered 2020-08-05: 140 mg via INTRAVENOUS

## 2020-08-05 MED ORDER — BUPIVACAINE-EPINEPHRINE (PF) 0.5% -1:200000 IJ SOLN
INTRAMUSCULAR | Status: DC | PRN
  Administered 2020-08-05: 10 mL

## 2020-08-05 MED ORDER — MIDAZOLAM HCL 2 MG/2ML IJ SOLN
INTRAMUSCULAR | Status: AC
  Filled 2020-08-05: qty 2

## 2020-08-05 MED ORDER — LACTATED RINGERS IV SOLN: INTRAVENOUS | Status: DC | PRN

## 2020-08-05 MED ORDER — OXYCODONE HCL 5 MG PO TABS: 5.0000 mg | ORAL_TABLET | Freq: Once | ORAL | Status: DC | PRN

## 2020-08-05 MED ORDER — CEFAZOLIN SODIUM-DEXTROSE 2-4 GM/100ML-% IV SOLN
2.0000 g | INTRAVENOUS | Status: AC
  Administered 2020-08-05: 2 g via INTRAVENOUS

## 2020-08-05 MED ORDER — OXYCODONE HCL 5 MG/5ML PO SOLN: 5.0000 mg | Freq: Once | ORAL | Status: DC | PRN

## 2020-08-05 MED ORDER — CHLORHEXIDINE GLUCONATE 0.12 % MT SOLN
OROMUCOSAL | Status: AC
  Administered 2020-08-05: 15 mL via OROMUCOSAL
  Filled 2020-08-05: qty 15

## 2020-08-05 MED ORDER — LIDOCAINE HCL (CARDIAC) PF 100 MG/5ML IV SOSY
PREFILLED_SYRINGE | INTRAVENOUS | Status: DC | PRN
  Administered 2020-08-05: 80 mg via INTRAVENOUS

## 2020-08-05 MED ORDER — HYDROCODONE-ACETAMINOPHEN 5-325 MG PO TABS: 1.0000 | ORAL_TABLET | ORAL | 0 refills | Status: AC | PRN

## 2020-08-05 MED ORDER — CHLORHEXIDINE GLUCONATE 0.12 % MT SOLN: 15.0000 mL | Freq: Once | OROMUCOSAL | Status: AC

## 2020-08-05 MED ORDER — CEFAZOLIN SODIUM-DEXTROSE 2-4 GM/100ML-% IV SOLN
INTRAVENOUS | Status: AC
  Filled 2020-08-05: qty 100

## 2020-08-05 MED ORDER — FENTANYL CITRATE (PF) 100 MCG/2ML IJ SOLN
INTRAMUSCULAR | Status: AC
  Filled 2020-08-05: qty 2

## 2020-08-05 MED ORDER — FENTANYL CITRATE (PF) 100 MCG/2ML IJ SOLN: 25.0000 ug | INTRAMUSCULAR | Status: DC | PRN

## 2020-08-05 MED ORDER — EPHEDRINE SULFATE 50 MG/ML IJ SOLN
INTRAMUSCULAR | Status: DC | PRN
  Administered 2020-08-05: 10 mg via INTRAVENOUS

## 2020-08-05 SURGICAL SUPPLY — 45 items
ADH SKN CLS APL DERMABOND .7 (GAUZE/BANDAGES/DRESSINGS) ×1
APL PRP STRL LF DISP 70% ISPRP (MISCELLANEOUS) ×1
BLADE SURG 15 STRL LF DISP TIS (BLADE) ×1 IMPLANT
BLADE SURG 15 STRL SS (BLADE) ×2
CANISTER SUCT 1200ML W/VALVE (MISCELLANEOUS) IMPLANT
CHLORAPREP W/TINT 26 (MISCELLANEOUS) ×2 IMPLANT
CNTNR SPEC 2.5X3XGRAD LEK (MISCELLANEOUS) ×1
CONT SPEC 4OZ STER OR WHT (MISCELLANEOUS) ×1
CONT SPEC 4OZ STRL OR WHT (MISCELLANEOUS) ×1
CONTAINER SPEC 2.5X3XGRAD LEK (MISCELLANEOUS) ×1 IMPLANT
COVER WAND RF STERILE (DRAPES) IMPLANT
DERMABOND ADVANCED (GAUZE/BANDAGES/DRESSINGS) ×1
DERMABOND ADVANCED .7 DNX12 (GAUZE/BANDAGES/DRESSINGS) ×1 IMPLANT
DEVICE DUBIN SPECIMEN MAMMOGRA (MISCELLANEOUS) ×2 IMPLANT
DRAPE LAPAROTOMY TRNSV 106X77 (MISCELLANEOUS) ×2 IMPLANT
ELECT CAUTERY BLADE TIP 2.5 (TIP) ×2
ELECT REM PT RETURN 9FT ADLT (ELECTROSURGICAL) ×2
ELECTRODE CAUTERY BLDE TIP 2.5 (TIP) ×1 IMPLANT
ELECTRODE REM PT RTRN 9FT ADLT (ELECTROSURGICAL) ×1 IMPLANT
GLOVE SURG ENC MOIS LTX SZ6.5 (GLOVE) ×2 IMPLANT
GLOVE SURG UNDER POLY LF SZ6.5 (GLOVE) ×2 IMPLANT
GOWN STRL REUS W/ TWL LRG LVL3 (GOWN DISPOSABLE) ×3 IMPLANT
GOWN STRL REUS W/TWL LRG LVL3 (GOWN DISPOSABLE) ×6
KIT MARKER MARGIN INK (KITS) IMPLANT
KIT TURNOVER KIT A (KITS) ×2 IMPLANT
LABEL OR SOLS (LABEL) ×2 IMPLANT
MANIFOLD NEPTUNE II (INSTRUMENTS) ×2 IMPLANT
MARGIN MAP 10MM (MISCELLANEOUS) IMPLANT
MARKER MARGIN CORRECT CLIP (MARKER) IMPLANT
NEEDLE HYPO 25X1 1.5 SAFETY (NEEDLE) ×2 IMPLANT
PACK BASIN MINOR ARMC (MISCELLANEOUS) ×2 IMPLANT
RETRACTOR RING XSMALL (MISCELLANEOUS) ×1 IMPLANT
RTRCTR WOUND ALEXIS 13CM XS SH (MISCELLANEOUS) ×2
SET LOCALIZER 20 PROBE US (MISCELLANEOUS) ×2 IMPLANT
SUT ETHILON 3-0 FS-10 30 BLK (SUTURE)
SUT MNCRL 4-0 (SUTURE) ×2
SUT MNCRL 4-0 27XMFL (SUTURE) ×1
SUT SILK 2 0 SH (SUTURE) ×2 IMPLANT
SUT VIC AB 3-0 SH 27 (SUTURE) ×2
SUT VIC AB 3-0 SH 27X BRD (SUTURE) ×1 IMPLANT
SUTURE EHLN 3-0 FS-10 30 BLK (SUTURE) IMPLANT
SUTURE MNCRL 4-0 27XMF (SUTURE) ×1 IMPLANT
SYR 10ML LL (SYRINGE) ×2 IMPLANT
SYR BULB IRRIG 60ML STRL (SYRINGE) ×2 IMPLANT
WATER STERILE IRR 1000ML POUR (IV SOLUTION) ×2 IMPLANT

## 2020-08-05 NOTE — Op Note (Signed)
Preoperative diagnosis: Right breast calcifications.  Postoperative diagnosis: Right breast calcifications.   Procedure: Right radiofrequency tag-localized excisional biopsy.                       Anesthesia: GETA  Surgeon: Dr. Hazle Quant  Wound Classification: Clean  Indications: Patient is a 75 y.o. female with a nonpalpable right calcifications mass noted on mammography with core biopsy demonstrating benign breast tissue discordant with images requires radiofrequency tag-localized excisional biopsy to rule out malignancy.   Findings: 1. Specimen mammography shows marker and tag on specimen 2. No other palpable mass or lymph node identified.   Description of procedure: Preoperative radiofrequency tag localization was performed by radiology. Localization studies were reviewed. The patient was taken to the operating room and placed supine on the operating table, and after general anesthesia the right chest and axilla were prepped and draped in the usual sterile fashion. A time-out was completed verifying correct patient, procedure, site, positioning, and implant(s) and/or special equipment prior to beginning this procedure.  By comparing the localization studies and interrogation with Localizer device, the probable trajectory and location of the mass was visualized. A circumareolar skin incision was planned in such a way as to minimize the amount of dissection to reach the mass.  The skin incision was made. Flaps were raised and the location of the tag was confirmed with Localizer device confirmed. A 2-0 silk figure-of-eight stay suture was placed and used for retraction. Dissection was then taken down circumferentially, taking care to include the entire localizing tag and a wide margin of grossly normal tissue. The specimen and entire localizing tag were removed. The specimen was oriented and sent to radiology with the localization studies. Confirmation was received that the entire target  lesion had been resected. The wound was irrigated. Hemostasis was checked. The wound was closed with interrupted sutures of 3-0 Vicryl and a subcuticular suture of Monocryl 3-0. No attempt was made to close the dead space.   Specimen: Right Breast excisional biopsy                       Complications: None  Estimated Blood Loss: 5 mL

## 2020-08-05 NOTE — Anesthesia Preprocedure Evaluation (Signed)
Anesthesia Evaluation  Patient identified by MRN, date of birth, ID band Patient awake    Reviewed: Allergy & Precautions, H&P , NPO status , Patient's Chart, lab work & pertinent test results  History of Anesthesia Complications Negative for: history of anesthetic complications  Airway Mallampati: III  TM Distance: <3 FB Neck ROM: full    Dental  (+) Edentulous Lower, Edentulous Upper   Pulmonary neg shortness of breath, asthma , former smoker,    Pulmonary exam normal        Cardiovascular Exercise Tolerance: Good hypertension, (-) angina(-) Past MI and (-) DOE Normal cardiovascular exam     Neuro/Psych PSYCHIATRIC DISORDERS negative neurological ROS     GI/Hepatic Neg liver ROS, GERD  ,  Endo/Other  negative endocrine ROS  Renal/GU Renal disease     Musculoskeletal  (+) Arthritis ,   Abdominal   Peds  Hematology negative hematology ROS (+)   Anesthesia Other Findings Past Medical History: No date: Abdominal pain, LLQ No date: Abdominal pain, RUQ No date: Arthritis No date: Asthma No date: C. difficile colitis No date: Diverticulitis No date: GERD (gastroesophageal reflux disease) No date: Hypertension No date: IBS (irritable bowel syndrome) No date: Shingles  Past Surgical History: No date: BACK SURGERY     Comment:  laminectomy posterior lumbar facetectomy 07/07/2020: BREAST BIOPSY; Right     Comment:  affirm bx calcs, x marker, FIBROCYSTIC CHANGES WITH               ASSOCIATED CALCIFICATIONS No date: CATARACT EXTRACTION, BILATERAL No date: CHOLECYSTECTOMY 10/2013: COLONOSCOPY 06/25/2018: COLONOSCOPY WITH PROPOFOL; N/A     Comment:  Procedure: COLONOSCOPY WITH PROPOFOL;  Surgeon: Scot Jun, MD;  Location: [facility] ENDOSCOPY;  Service:               Endoscopy;  Laterality: N/A; 06/25/2018: ESOPHAGOGASTRODUODENOSCOPY (EGD) WITH PROPOFOL; N/A     Comment:  Procedure:  ESOPHAGOGASTRODUODENOSCOPY (EGD) WITH               PROPOFOL;  Surgeon: Scot Jun, MD;  Location:               [facility] ENDOSCOPY;  Service: Endoscopy;  Laterality: N/A; No date: EYE SURGERY  BMI    Body Mass Index: 27.45 kg/m      Reproductive/Obstetrics negative OB ROS                             Anesthesia Physical Anesthesia Plan  ASA: III  Anesthesia Plan: General LMA   Post-op Pain Management:    Induction: Intravenous  PONV Risk Score and Plan: Dexamethasone, Ondansetron, Midazolam and Treatment may vary due to age or medical condition  Airway Management Planned: LMA  Additional Equipment:   Intra-op Plan:   Post-operative Plan: Extubation in OR  Informed Consent: I have reviewed the patients History and Physical, chart, labs and discussed the procedure including the risks, benefits and alternatives for the proposed anesthesia with the patient or authorized representative who has indicated his/her understanding and acceptance.     Dental Advisory Given  Plan Discussed with: Anesthesiologist, CRNA and Surgeon  Anesthesia Plan Comments: (Patient consented for risks of anesthesia including but not limited to:  - adverse reactions to medications - damage to eyes, teeth, lips or other oral mucosa - nerve damage due to positioning  - sore throat or hoarseness -  Damage to heart, brain, nerves, lungs, other parts of body or loss of life  Patient voiced understanding.)        Anesthesia Quick Evaluation

## 2020-08-05 NOTE — Interval H&P Note (Signed)
History and Physical Interval Note:  08/05/2020 12:34 PM  Julie Davis  has presented today for surgery, with the diagnosis of R92.8 abnormal finding on mammography.  The various methods of treatment have been discussed with the patient and family. After consideration of risks, benefits and other options for treatment, the patient has consented to  Procedure(s): BREAST BIOPSY WITH RADIO FREQUENCY LOCALIZER (Right) as a surgical intervention.  The patient's history has been reviewed, patient examined, no change in status, stable for surgery.  I have reviewed the patient's chart and labs.  Right breast marked in the pre procedure room. Questions were answered to the patient's satisfaction.     Carolan Shiver

## 2020-08-05 NOTE — Discharge Instructions (Signed)
°  Diet: Resume home heart healthy regular diet.  ° °Activity: Increase activity as tolerated. Light activity and walking are encouraged. Do not drive or drink alcohol if taking narcotic pain medications. ° °Wound care: May shower with soapy water and pat dry (do not rub incisions), but no baths or submerging incision underwater until follow-up. (no swimming)  ° °Medications: Resume all home medications. For mild to moderate pain: acetaminophen (Tylenol) or ibuprofen (if no kidney disease). Combining Tylenol with alcohol can substantially increase your risk of causing liver disease. Narcotic pain medications, if prescribed, can be used for severe pain, though may cause nausea, constipation, and drowsiness. Do not combine Tylenol and Norco within a 6 hour period as Norco contains Tylenol. If you do not need the narcotic pain medication, you do not need to fill the prescription. ° °Call office ([phone]) at any time if any questions, worsening pain, fevers/chills, bleeding, drainage from incision site, or other concerns. ° °AMBULATORY SURGERY  °DISCHARGE INSTRUCTIONS ° ° °1) The drugs that you were given will stay in your system until tomorrow so for the next 24 hours you should not: ° °A) Drive an automobile °B) Make any legal decisions °C) Drink any alcoholic beverage ° ° °2) You may resume regular meals tomorrow.  Today it is better to start with liquids and gradually work up to solid foods. ° °You may eat anything you prefer, but it is better to start with liquids, then soup and crackers, and gradually work up to solid foods. ° ° °3) Please notify your doctor immediately if you have any unusual bleeding, trouble breathing, redness and pain at the surgery site, drainage, fever, or pain not relieved by medication. ° ° ° °4) Additional Instructions: ° ° ° ° ° ° ° °Please contact your physician with any problems or Same Day Surgery at [phone], Monday through Friday 6 am to 4 pm, or [place] [facility] [place] [facility]  number at [phone]. °

## 2020-08-05 NOTE — Transfer of Care (Signed)
Immediate Anesthesia Transfer of Care Note  Patient: Julie Davis  Procedure(s) Performed: BREAST BIOPSY WITH RADIO FREQUENCY LOCALIZER (Right Breast)  Patient Location: PACU  Anesthesia Type:General  Level of Consciousness: awake and oriented  Airway & Oxygen Therapy: Patient Spontanous Breathing and Patient connected to face mask oxygen  Post-op Assessment: Report given to RN and Post -op Vital signs reviewed and stable  Post vital signs: Reviewed and stable  Last Vitals:  Vitals Value Taken Time  BP 143/45 08/05/20 1417  Temp    Pulse 62 08/05/20 1419  Resp 13 08/05/20 1419  SpO2 100 % 08/05/20 1419  Vitals shown include unvalidated device data.  Last Pain:  Vitals:   08/05/20 1147  TempSrc: Oral  PainSc: 0-No pain         Complications: No complications documented.

## 2020-08-06 ENCOUNTER — Encounter: Payer: Self-pay | Admitting: General Surgery

## 2020-08-07 LAB — SURGICAL PATHOLOGY

## 2020-08-08 NOTE — Anesthesia Postprocedure Evaluation (Signed)
Anesthesia Post Note  Patient: Julie Davis  Procedure(s) Performed: BREAST BIOPSY WITH RADIO FREQUENCY LOCALIZER (Right Breast)  Patient location during evaluation: PACU Anesthesia Type: General Level of consciousness: awake and alert and oriented Pain management: pain level controlled Vital Signs Assessment: post-procedure vital signs reviewed and stable Respiratory status: spontaneous breathing Cardiovascular status: blood pressure returned to baseline Anesthetic complications: no   No complications documented.   Last Vitals:  Vitals:   08/05/20 1503 08/05/20 1535  BP: (!) 148/59 (!) 148/55  Pulse: 69 73  Resp: 17 16  Temp: (!) 36.2 C 36.7 C  SpO2: 99% 99%    Last Pain:  Vitals:   08/05/20 1535  TempSrc: Temporal  PainSc: 0-No pain                 Rebekha Diveley

## 2020-11-23 ENCOUNTER — Ambulatory Visit
Admission: RE | Admit: 2020-11-23 | Discharge: 2020-11-23 | Disposition: A | Discharge status: Home / Self Care | Payer: Medicare HMO | Source: Ambulatory Visit | Attending: Nephrology | Admitting: Nephrology

## 2020-11-23 ENCOUNTER — Other Ambulatory Visit: Payer: Self-pay

## 2020-11-23 DIAGNOSIS — I10 Essential (primary) hypertension: Secondary | ICD-10-CM | POA: Diagnosis present

## 2020-11-23 DIAGNOSIS — N1832 Chronic kidney disease, stage 3b: Secondary | ICD-10-CM | POA: Insufficient documentation

## 2021-07-29 IMAGING — MG MM PLC BREAST LOC DEV 1ST LESION INC*R*
8 series · 8 of 8 positions shown · non-contrast
Comparison: Previous exam(s)

CLINICAL DATA: Recently biopsied linear amorphous
microcalcifications in the right breast with discordant benign
biopsy results.

EXAM:
MAMMOGRAPHIC GUIDED RADIOFREQUENCY DEVICE
LOCALIZATION OF THE RIGHT BREAST

[R CC (1 of 4)]
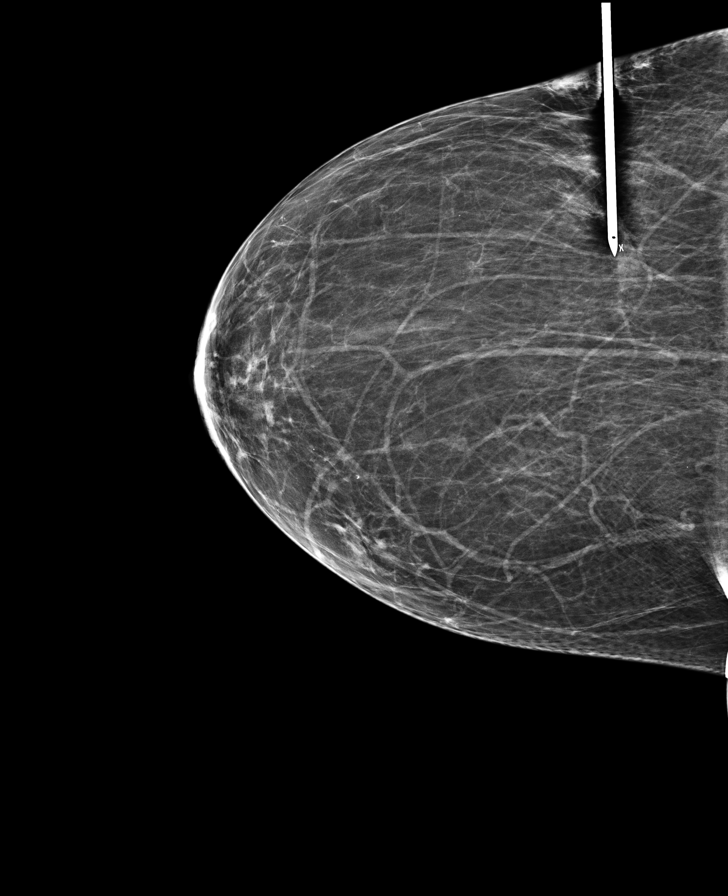

[R CC (2 of 4)]
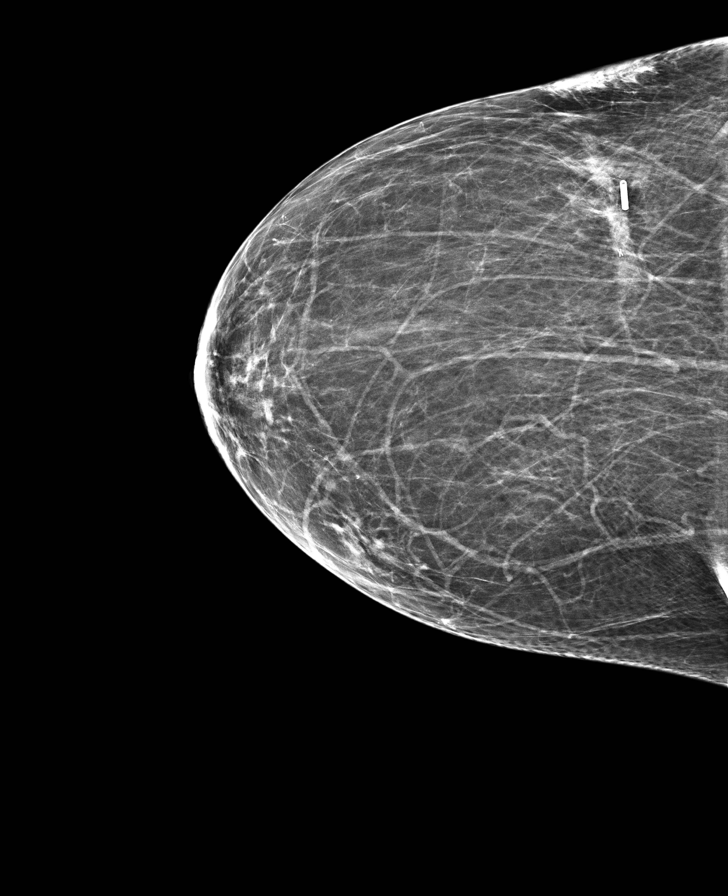

[R CC (3 of 4)]
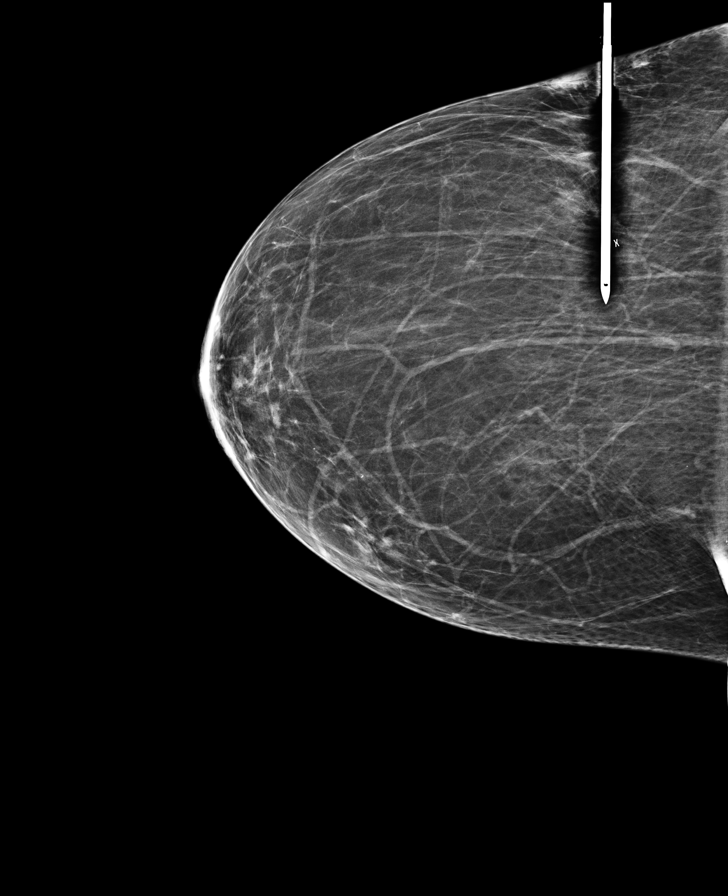

[R LM (1 of 4)]
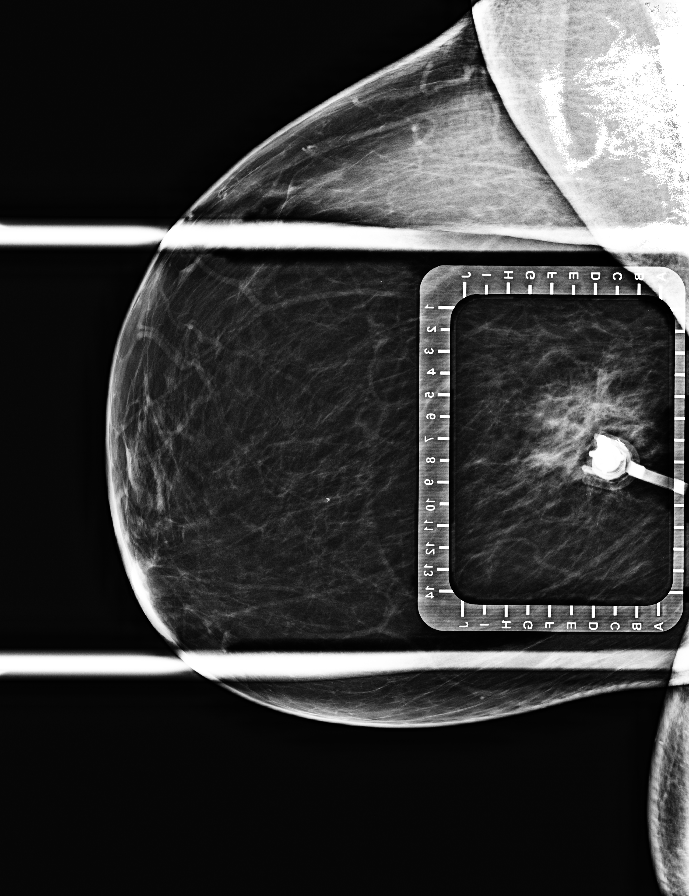

[R LM (2 of 4)]
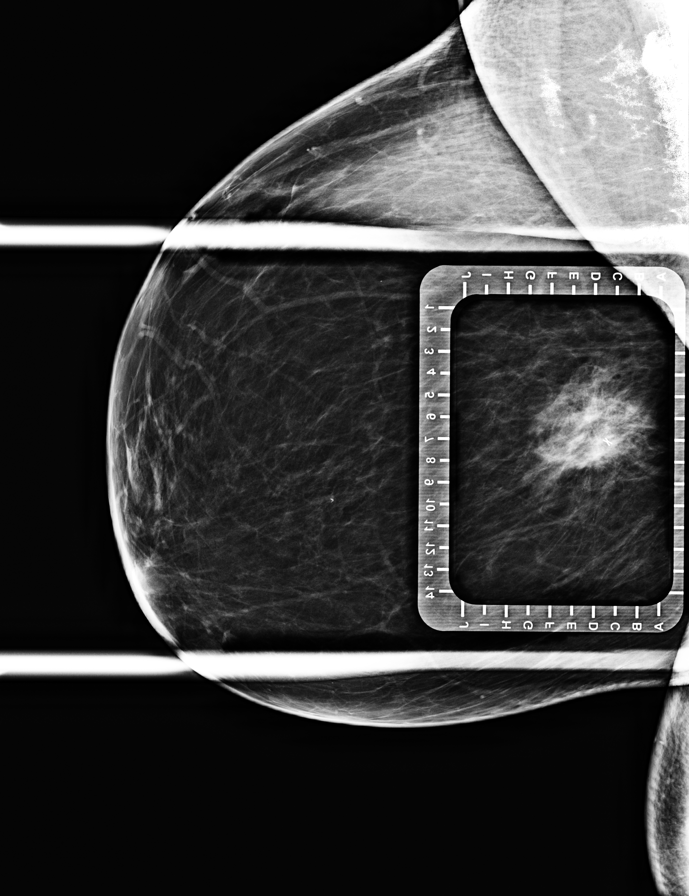

[R LM (3 of 4)]
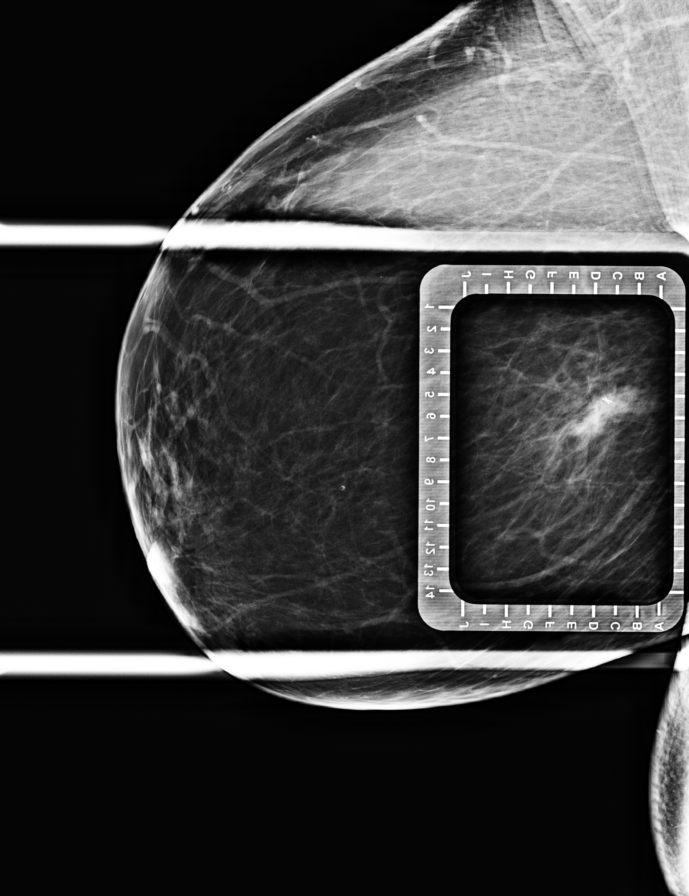

[R LM (4 of 4)]
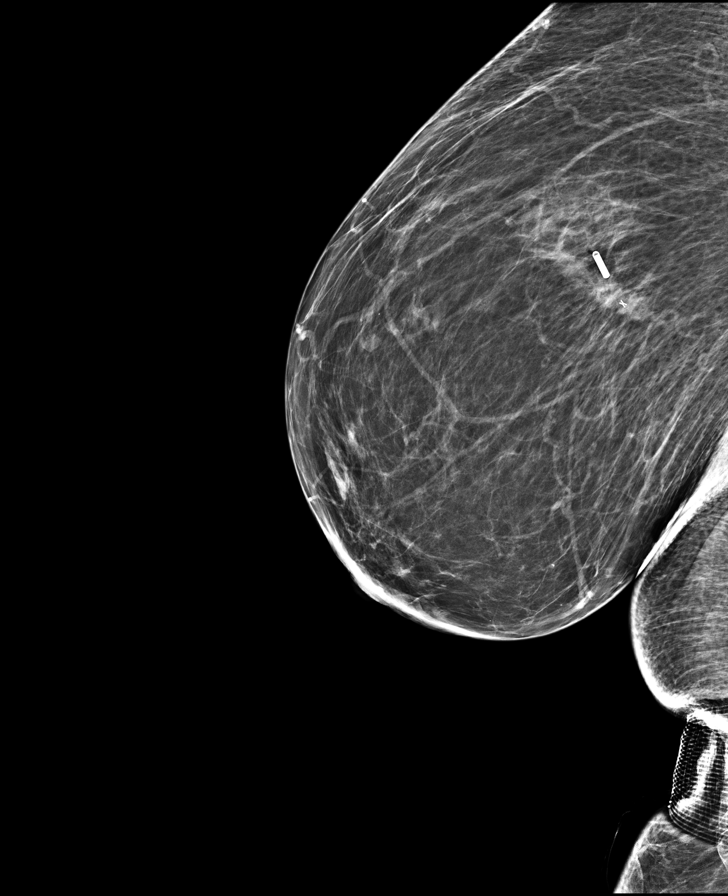

[R CC (4 of 4)]
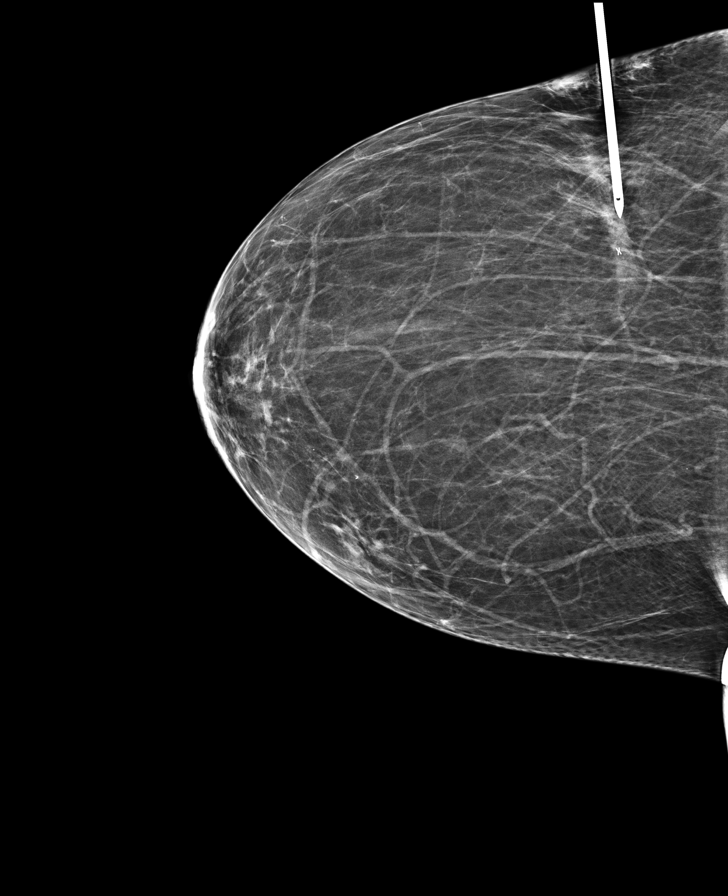

[8 of 8 positions shown; findings below may reference images not displayed]

FINDINGS: Patient presents for radiofrequency device localization prior to
surgical excision. I met with the patient and we discussed the
procedure of radiofrequency device localization including benefits
and alternatives. We discussed the high likelihood of a successful
procedure. We discussed the risks of the procedure including
infection, bleeding, tissue injury and further surgery. Informed,
written consent was given.

The usual time-out protocol was performed immediately prior to the
procedure.

Using mammographic guidance, sterile technique, 1% lidocaine as
local anesthesia, a radiofrequency tag was used to localize
amorphous microcalcifications in the outer right breast at posterior
depth using a lateral approach.

The follow-up mammogram images confirm that the RF device is in the
expected location and are marked for the patient's surgeon.

The patient tolerated the procedure well and was released from the
[REDACTED].
IMPRESSION: Radiofrequency device localization of the RIGHT breast. No apparent
complications.

## 2021-11-16 ENCOUNTER — Emergency Department: Payer: Medicare HMO

## 2021-11-16 ENCOUNTER — Inpatient Hospital Stay
Admission: EM | Admit: 2021-11-16 | Discharge: 2021-11-19 | DRG: 481 | Disposition: A | Discharge status: Skilled Nursing Facility | Payer: Medicare HMO | Attending: Family Medicine | Admitting: Family Medicine

## 2021-11-16 ENCOUNTER — Encounter: Payer: Self-pay | Admitting: Internal Medicine

## 2021-11-16 DIAGNOSIS — S72145A Nondisplaced intertrochanteric fracture of left femur, initial encounter for closed fracture: Secondary | ICD-10-CM | POA: Diagnosis present

## 2021-11-16 DIAGNOSIS — Z87891 Personal history of nicotine dependence: Secondary | ICD-10-CM | POA: Diagnosis not present

## 2021-11-16 DIAGNOSIS — D72829 Elevated white blood cell count, unspecified: Secondary | ICD-10-CM

## 2021-11-16 DIAGNOSIS — F0390 Unspecified dementia without behavioral disturbance: Secondary | ICD-10-CM | POA: Diagnosis present

## 2021-11-16 DIAGNOSIS — K219 Gastro-esophageal reflux disease without esophagitis: Secondary | ICD-10-CM | POA: Diagnosis present

## 2021-11-16 DIAGNOSIS — K589 Irritable bowel syndrome without diarrhea: Secondary | ICD-10-CM | POA: Diagnosis present

## 2021-11-16 DIAGNOSIS — M50323 Other cervical disc degeneration at C6-C7 level: Secondary | ICD-10-CM | POA: Diagnosis present

## 2021-11-16 DIAGNOSIS — Y92009 Unspecified place in unspecified non-institutional (private) residence as the place of occurrence of the external cause: Secondary | ICD-10-CM | POA: Diagnosis not present

## 2021-11-16 DIAGNOSIS — I739 Peripheral vascular disease, unspecified: Secondary | ICD-10-CM | POA: Diagnosis present

## 2021-11-16 DIAGNOSIS — Z8619 Personal history of other infectious and parasitic diseases: Secondary | ICD-10-CM | POA: Diagnosis not present

## 2021-11-16 DIAGNOSIS — Z79899 Other long term (current) drug therapy: Secondary | ICD-10-CM | POA: Diagnosis not present

## 2021-11-16 DIAGNOSIS — M1712 Unilateral primary osteoarthritis, left knee: Secondary | ICD-10-CM | POA: Diagnosis present

## 2021-11-16 DIAGNOSIS — M47812 Spondylosis without myelopathy or radiculopathy, cervical region: Secondary | ICD-10-CM | POA: Diagnosis present

## 2021-11-16 DIAGNOSIS — F03B4 Unspecified dementia, moderate, with anxiety: Secondary | ICD-10-CM | POA: Diagnosis not present

## 2021-11-16 DIAGNOSIS — F0394 Unspecified dementia, unspecified severity, with anxiety: Secondary | ICD-10-CM | POA: Diagnosis present

## 2021-11-16 DIAGNOSIS — Z888 Allergy status to other drugs, medicaments and biological substances status: Secondary | ICD-10-CM

## 2021-11-16 DIAGNOSIS — R809 Proteinuria, unspecified: Secondary | ICD-10-CM | POA: Diagnosis present

## 2021-11-16 DIAGNOSIS — J45909 Unspecified asthma, uncomplicated: Secondary | ICD-10-CM | POA: Diagnosis present

## 2021-11-16 DIAGNOSIS — N1832 Chronic kidney disease, stage 3b: Secondary | ICD-10-CM | POA: Diagnosis present

## 2021-11-16 DIAGNOSIS — M25552 Pain in left hip: Secondary | ICD-10-CM | POA: Diagnosis present

## 2021-11-16 DIAGNOSIS — F0393 Unspecified dementia, unspecified severity, with mood disturbance: Secondary | ICD-10-CM | POA: Diagnosis present

## 2021-11-16 DIAGNOSIS — I10 Essential (primary) hypertension: Secondary | ICD-10-CM | POA: Diagnosis not present

## 2021-11-16 DIAGNOSIS — N179 Acute kidney failure, unspecified: Secondary | ICD-10-CM | POA: Diagnosis present

## 2021-11-16 DIAGNOSIS — S72002A Fracture of unspecified part of neck of left femur, initial encounter for closed fracture: Secondary | ICD-10-CM

## 2021-11-16 DIAGNOSIS — E785 Hyperlipidemia, unspecified: Secondary | ICD-10-CM | POA: Diagnosis present

## 2021-11-16 DIAGNOSIS — W010XXA Fall on same level from slipping, tripping and stumbling without subsequent striking against object, initial encounter: Secondary | ICD-10-CM | POA: Diagnosis present

## 2021-11-16 DIAGNOSIS — I129 Hypertensive chronic kidney disease with stage 1 through stage 4 chronic kidney disease, or unspecified chronic kidney disease: Secondary | ICD-10-CM | POA: Diagnosis present

## 2021-11-16 DIAGNOSIS — M4802 Spinal stenosis, cervical region: Secondary | ICD-10-CM | POA: Diagnosis present

## 2021-11-16 DIAGNOSIS — Y9301 Activity, walking, marching and hiking: Secondary | ICD-10-CM | POA: Diagnosis present

## 2021-11-16 DIAGNOSIS — F418 Other specified anxiety disorders: Secondary | ICD-10-CM | POA: Diagnosis not present

## 2021-11-16 LAB — CBC WITH DIFFERENTIAL/PLATELET
Abs Immature Granulocytes: 0.05 10*3/uL (ref 0.00–0.07)
Basophils Absolute: 0.1 10*3/uL (ref 0.0–0.1)
Basophils Relative: 0 %
Eosinophils Absolute: 0 10*3/uL (ref 0.0–0.5)
Eosinophils Relative: 0 %
HCT: 36.5 % (ref 36.0–46.0)
Hemoglobin: 12.1 g/dL (ref 12.0–15.0)
Immature Granulocytes: 0 %
Lymphocytes Relative: 12 %
Lymphs Abs: 1.6 10*3/uL (ref 0.7–4.0)
MCH: 31.5 pg (ref 26.0–34.0)
MCHC: 33.2 g/dL (ref 30.0–36.0)
MCV: 95.1 fL (ref 80.0–100.0)
Monocytes Absolute: 1 10*3/uL (ref 0.1–1.0)
Monocytes Relative: 7 %
Neutro Abs: 11.2 10*3/uL — ABNORMAL HIGH (ref 1.7–7.7)
Neutrophils Relative %: 81 %
Platelets: 242 10*3/uL (ref 150–400)
RBC: 3.84 MIL/uL — ABNORMAL LOW (ref 3.87–5.11)
RDW: 11.4 % — ABNORMAL LOW (ref 11.5–15.5)
WBC: 13.9 10*3/uL — ABNORMAL HIGH (ref 4.0–10.5)
nRBC: 0 % (ref 0.0–0.2)

## 2021-11-16 LAB — COMPREHENSIVE METABOLIC PANEL
ALT: 24 U/L (ref 0–44)
AST: 41 U/L (ref 15–41)
Albumin: 3.5 g/dL (ref 3.5–5.0)
Alkaline Phosphatase: 72 U/L (ref 38–126)
Anion gap: 10 (ref 5–15)
BUN: 21 mg/dL (ref 8–23)
CO2: 24 mmol/L (ref 22–32)
Calcium: 9.1 mg/dL (ref 8.9–10.3)
Chloride: 104 mmol/L (ref 98–111)
Creatinine, Ser: 1.35 mg/dL — ABNORMAL HIGH (ref 0.44–1.00)
GFR, Estimated: 41 mL/min — ABNORMAL LOW (ref >60)
Glucose, Bld: 125 mg/dL — ABNORMAL HIGH (ref 70–99)
Potassium: 3.6 mmol/L (ref 3.5–5.1)
Sodium: 138 mmol/L (ref 135–145)
Total Bilirubin: 1 mg/dL (ref 0.3–1.2)
Total Protein: 6.8 g/dL (ref 6.5–8.1)

## 2021-11-16 MED ORDER — ACETAMINOPHEN 500 MG PO TABS
1000.0000 mg | ORAL_TABLET | Freq: Once | ORAL | Status: AC
  Administered 2021-11-16: 1000 mg via ORAL
  Filled 2021-11-16: qty 2

## 2021-11-16 MED ORDER — HEPARIN SODIUM (PORCINE) 5000 UNIT/ML IJ SOLN
5000.0000 [IU] | Freq: Three times a day (TID) | INTRAMUSCULAR | Status: AC
  Administered 2021-11-16: 5000 [IU] via SUBCUTANEOUS
  Filled 2021-11-16: qty 1

## 2021-11-16 MED ORDER — ACETAMINOPHEN 650 MG RE SUPP: 650.0000 mg | RECTAL | Status: DC | PRN

## 2021-11-16 MED ORDER — MEMANTINE HCL 5 MG PO TABS
5.0000 mg | ORAL_TABLET | Freq: Two times a day (BID) | ORAL | Status: DC
  Administered 2021-11-16 – 2021-11-19 (×6): 5 mg via ORAL
  Filled 2021-11-16 (×6): qty 1

## 2021-11-16 MED ORDER — ALPRAZOLAM 0.5 MG PO TABS
0.5000 mg | ORAL_TABLET | Freq: Two times a day (BID) | ORAL | Status: DC | PRN
  Administered 2021-11-18: 0.5 mg via ORAL
  Filled 2021-11-16: qty 1

## 2021-11-16 MED ORDER — MORPHINE SULFATE (PF) 2 MG/ML IV SOLN: 0.5000 mg | INTRAVENOUS | Status: DC | PRN

## 2021-11-16 MED ORDER — PANTOPRAZOLE SODIUM 40 MG PO TBEC
40.0000 mg | DELAYED_RELEASE_TABLET | Freq: Every day | ORAL | Status: DC
  Administered 2021-11-17 – 2021-11-19 (×3): 40 mg via ORAL
  Filled 2021-11-16 (×3): qty 1

## 2021-11-16 MED ORDER — GALANTAMINE HYDROBROMIDE 4 MG PO TABS
4.0000 mg | ORAL_TABLET | Freq: Two times a day (BID) | ORAL | Status: DC
Start: 2021-11-16 — End: 2021-11-19
  Administered 2021-11-16 – 2021-11-19 (×6): 4 mg via ORAL
  Filled 2021-11-16 (×6): qty 1

## 2021-11-16 MED ORDER — ACETAMINOPHEN 325 MG PO TABS: 650.0000 mg | ORAL_TABLET | Freq: Four times a day (QID) | ORAL | Status: DC | PRN

## 2021-11-16 MED ORDER — LOSARTAN POTASSIUM 25 MG PO TABS
25.0000 mg | ORAL_TABLET | Freq: Every day | ORAL | Status: DC
  Administered 2021-11-16 – 2021-11-17 (×2): 25 mg via ORAL
  Filled 2021-11-16 (×2): qty 1

## 2021-11-16 MED ORDER — HYDROCODONE-ACETAMINOPHEN 5-325 MG PO TABS
1.0000 | ORAL_TABLET | Freq: Four times a day (QID) | ORAL | Status: DC | PRN
  Administered 2021-11-16: 2 via ORAL
  Filled 2021-11-16: qty 2

## 2021-11-16 MED ORDER — ATORVASTATIN CALCIUM 10 MG PO TABS
10.0000 mg | ORAL_TABLET | Freq: Every day | ORAL | Status: DC
Start: 2021-11-16 — End: 2021-11-19
  Administered 2021-11-16 – 2021-11-18 (×3): 10 mg via ORAL
  Filled 2021-11-16 (×3): qty 1

## 2021-11-16 MED ORDER — VITAMIN B-12 1000 MCG PO TABS
1000.0000 ug | ORAL_TABLET | Freq: Every day | ORAL | Status: DC
  Administered 2021-11-17 – 2021-11-19 (×3): 1000 ug via ORAL
  Filled 2021-11-16 (×3): qty 1

## 2021-11-16 MED ORDER — POLYETHYLENE GLYCOL 3350 17 G PO PACK: 17.0000 g | PACK | Freq: Every day | ORAL | Status: DC | PRN

## 2021-11-16 MED ORDER — CEFAZOLIN SODIUM-DEXTROSE 2-4 GM/100ML-% IV SOLN
2.0000 g | INTRAVENOUS | Status: AC
  Administered 2021-11-17: 2 g via INTRAVENOUS
  Filled 2021-11-16 (×2): qty 100

## 2021-11-16 MED ORDER — PAROXETINE HCL 20 MG PO TABS
20.0000 mg | ORAL_TABLET | Freq: Every day | ORAL | Status: DC
  Administered 2021-11-16 – 2021-11-19 (×4): 20 mg via ORAL
  Filled 2021-11-16 (×4): qty 1

## 2021-11-16 MED ORDER — AMLODIPINE BESYLATE 10 MG PO TABS
10.0000 mg | ORAL_TABLET | Freq: Every day | ORAL | Status: DC
Start: 2021-11-17 — End: 2021-11-19
  Administered 2021-11-17 – 2021-11-19 (×3): 10 mg via ORAL
  Filled 2021-11-16: qty 1
  Filled 2021-11-16: qty 2
  Filled 2021-11-16: qty 1

## 2021-11-16 MED ORDER — TRAMADOL HCL 50 MG PO TABS: 100.0000 mg | ORAL_TABLET | Freq: Three times a day (TID) | ORAL | Status: DC | PRN

## 2021-11-16 NOTE — ED Triage Notes (Signed)
Per EMS, Pt arrived from home due to  a fall. Per pt daughter pt fell this around 0600. Daughter put pt back in the bed and pt had EMS to be notified of her needing to go to the hospital. Pt fall was a witnessed fall and did not have any LOC. ?

## 2021-11-16 NOTE — Assessment & Plan Note (Addendum)
Likely reactive. Continue to monitor. 

## 2021-11-16 NOTE — Assessment & Plan Note (Addendum)
Continue galantamine , Namenda ?

## 2021-11-16 NOTE — Hospital Course (Addendum)
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 CT of the head showed no acute intracranial findings.  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 ?CT cervical spine without contrast: No acute fracture or listhesis of the cervical spine. ?Left complete knee x-rays: Mild tricompartment arthritis.  No acute osseous abnormality. ?Left humerus: Negative. ?Left hip x-ray: Acute slightly angulated left femoral intertrochanteric fracture. ?Patient was admitted for left intertrochanteric femur fracture, orthopedic is consulted, Patient underwent ORIF postoperative day 2, tolerated well.  PT and OT recommended SNF.  Patient has been doing better patient is cleared from orthopedics to be discharged to SNF.  Patient is being discharged to SNF for rehab.  Patient will continue aspirin 81 mg twice daily for postoperative DVT prophylaxis.  Patient to follow-up with orthopedics in 2 weeks. ?

## 2021-11-16 NOTE — H&P (Addendum)
?History and Physical  ? ?Julie Davis [id number] DOB: [birth year] DOA: 11/16/2021 ? ?PCP: Rusty Aus, MD  ?Outpatient Specialists: Dr. Lanora Manis, nephrology ?Patient coming from: Home via EMS ? ?I have personally briefly reviewed patient's old medical records in [place]. ? ?Chief Concern: Left hip pain ? ?HPI: Ms. 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 ? ?Initial vitals in the emergency department show temperature of 98.2, respiration rate of 22, heart rate of 80, blood pressure 182/85, SPO2 of 97% on room air. ? ?Serum sodium is 138, potassium 3.6, chloride 104, bicarb 24, nonfasting blood glucose 125, BUN of 21, serum creatinine of 1.35, GFR 41. ? ?WBC is 13.9, hemoglobin of 12.1, platelets of 242. ? ?CT of the head without contrast was read as no acute intracranial findings.  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 ? ?CT cervical spine without contrast: No acute fracture or listhesis of the cervical spine.  Ossification of the posterior longitudinal ligament in combination with degenerative changes resulting in moderate central canal stenosis at C5-C6.  Degenerative disc and degenerative joint disease, resulting in mild to moderate left neuroforaminal narrowing at C6-C7.  Peripheral vascular disease with bilateral carotid artery calcification. ? ?Left complete knee x-rays: Mild tricompartment arthritis.  No acute osseous abnormality. ? ?Left humerus: Negative. ? ?Left hip x-ray: Acute slightly angulated left femoral intertrochanteric fracture. ? ?ED provider consulted orthopedic provider, Dr. Belva Crome ? ?ED treatment: Acetaminophen 1 g. ? ?At bedside, she is a very pleasant 76 year old  with dementia, and she was able to tell me her name, and identify her daughter, Lattie Haw at bedside.  ? ?She was not able to tell me her age, current calendar year, current location.  ? ?Per daughter at bedside, patient's husband found her on floor. Family assumed she was either getting up to go use the bathroom or get water because it was very early in the morning and patient only sleeps till late morning or noon. The fall was unwitnessed.  ? ?At bedside patient denies pain. Then she was reminded that her hip was hurting earlier.  ? ?Social history: She lives at home with her daughter and husband. She is a former tobacco user, quitting about 30 years ago. 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  ? ?Vaccination history: She is vaccinated for covid and influenza.  ? ?ROS: Unable to complete as patient has moderate to severe dementia ? ?ED Course: Discussed with emergency medicine provider, patient requiring hospitalization for chief concerns of left hip fracture. ? ?Assessment/Plan ? ?Principal Problem: ?  Closed left hip fracture ([place]) ?Active Problems: ?  Dementia ([place]) ?  Leukocytosis ?  Essential hypertension ?  Depression with anxiety ?  Stage 3b chronic kidney disease (CKD) ([place]) ?  ?Assessment and Plan: ? ?* Closed left hip fracture ([place]) ?- Presumed secondary to mechanical fall ?- Pain control: Norco 5-325, 1 to 2 tablets every 6 hours as needed for moderate pain; morphine 0.5 mg IV every 2 hours as needed for severe pain ?- TOC consulted for SNF and DME ?- Orthopedic, Dr. Mack Guise, is aware of patient and pending OR schedule, patient may go to the OR on 11/17/2021 ?- N.p.o. after midnight ?- Admit to [facility], inpatient ? ?Stage 3b chronic kidney disease (CKD) (  [place]) ?- At baseline ? ?Depression with anxiety ?- Resumed home paroxetine 20 mg daily, alprazolam 0.5 mg p.o. twice daily as needed for anxiety ? ?Essential hypertension ?- Amlodipine 10 mg daily, losartan 25 mg daily  resumed ? ?Leukocytosis ?- I suspect this is secondary to reactive from acute hip fracture ?- CBC in a.m. ? ?Dementia ([place]) ?- Resumed home galantamine 4 mg twice daily, memantine 5 mg twice daily ? ?DVT prophylaxis-on admission I have ordered heparin 5000 units subcutaneous one-time dose in anticipation of the OR in tomorrow ?- AM team to reinitiate pharmacologic DVT prophylaxis when its benefits outweigh its risk of bleeding ? ?Fall precautions ? ?Chart reviewed.  ? ?DVT prophylaxis: Heparin 5000 units subcutaneous every 8 hours, one-time dose ordered ?Code Status: Full code ?Diet: Heart healthy/carb modified; n.p.o. after midnight except for meds ?Family Communication: Updated daughter, Lattie Haw at bedside ?Disposition Plan: Pending clinical course, orthopedic evaluation ?Consults called: Orthopedic service, Dr. Mack Guise ?Admission status: MedSurg, inpatient, no telemetry ? ?Past Medical History:  ?Diagnosis Date  ? Abdominal pain, LLQ   ? Abdominal pain, RUQ   ? Arthritis   ? Asthma   ? C. difficile colitis   ? Diverticulitis   ? GERD (gastroesophageal reflux disease)   ? Hypertension   ? IBS (irritable bowel syndrome)   ? Shingles   ? ?Past Surgical History:  ?Procedure Laterality Date  ? BACK SURGERY    ? laminectomy posterior lumbar facetectomy  ? BREAST BIOPSY Right 07/07/2020  ? affirm bx calcs, x marker, FIBROCYSTIC CHANGES WITH ASSOCIATED CALCIFICATIONS  ? BREAST BIOPSY WITH RADIO FREQUENCY LOCALIZER Right 08/05/2020  ? Procedure: BREAST BIOPSY WITH RADIO FREQUENCY LOCALIZER;  Surgeon: Herbert Pun, MD;  Location: [facility] ORS;  Service: General;  Laterality: Right;  ? CATARACT EXTRACTION, BILATERAL    ? CHOLECYSTECTOMY    ? COLONOSCOPY  10/2013  ? COLONOSCOPY WITH PROPOFOL N/A 06/25/2018  ? Procedure: COLONOSCOPY WITH PROPOFOL;  Surgeon: Manya Silvas, MD;  [facility] ENDOSCOPY;  Service: Endoscopy;  Laterality: N/A;  ? ESOPHAGOGASTRODUODENOSCOPY (EGD) WITH PROPOFOL N/A 06/25/2018  ? Procedure:  ESOPHAGOGASTRODUODENOSCOPY (EGD) WITH PROPOFOL;  Surgeon: Manya Silvas, MD;  Location: [facility] ENDOSCOPY;  Service: Endoscopy;  Laterality: N/A;  ? EYE SURGERY    ? ?Social History:  reports that she has quit smoking. She has never used smokeless tobacco. She reports that she does not drink alcohol and does not use drugs. ? ?Allergies  ?Allergen Reactions  ? Methotrexate Derivatives Diarrhea  ? ?Family History  ?Problem Relation Age of Onset  ? Breast cancer Father 50  ? ?Family history: Family history reviewed and not pertinent. ? ?Prior to Admission medications   ?Medication Sig Start Date End Date Taking? Authorizing Provider  ?ALPRAZolam (XANAX) 0.5 MG tablet Take 0.5 mg by mouth 2 (two) times daily as needed for anxiety.    Yes [provider]  ?amLODipine (NORVASC) 10 MG tablet Take 10 mg by mouth daily.   Yes [provider]  ?atorvastatin (LIPITOR) 10 MG tablet Take 10 mg by mouth daily.   Yes [provider]  ?ergocalciferol (VITAMIN D2) 1.25 MG (50000 UT) capsule Take 50,000 Units by mouth every Monday.   Yes [provider]  ?galantamine (RAZADYNE) 4 MG tablet Take 4 mg by mouth 2 (two) times daily. 11/15/21  Yes [provider]  ?losartan (COZAAR) 25 MG tablet Take 25 mg by mouth daily. 09/04/21  Yes [provider]  ?memantine (NAMENDA) 5 MG tablet Take 5 mg by  mouth 2 (two) times daily. 11/08/21  Yes [provider]  ?pantoprazole (PROTONIX) 40 MG tablet Take 40 mg by mouth daily.   Yes [provider]  ?PARoxetine (PAXIL) 20 MG tablet Take 20 mg by mouth daily. 11/03/21  Yes [provider]  ?traMADol (ULTRAM) 50 MG tablet Take 100 mg by mouth 3 (three) times daily as needed for moderate pain or severe pain.    Yes [provider]  ?vitamin B-12 (CYANOCOBALAMIN) 1000 MCG tablet Take 1,000 mcg by mouth daily.   Yes [provider]  ?vitamin k 100 MCG tablet Take 100 mcg by mouth daily.   Yes [provider]  ?donepezil (ARICEPT) 10 MG tablet Take 10 mg by mouth daily. ?Patient not taking: Reported on 11/16/2021 01/15/20   [provider]  ?escitalopram (LEXAPRO) 10 MG tablet Take 10 mg by mouth daily. ?Patient

## 2021-11-16 NOTE — ED Notes (Signed)
Pt resting quietly.  Family with pt.   

## 2021-11-16 NOTE — Assessment & Plan Note (Deleted)
Renal functions at baseline. ?

## 2021-11-16 NOTE — Assessment & Plan Note (Addendum)
Continue amlodipine and losartan 

## 2021-11-16 NOTE — Assessment & Plan Note (Addendum)
Continue Paxil daily and alprazolam as needed ?

## 2021-11-16 NOTE — Assessment & Plan Note (Addendum)
Patient presented s/p mechanical fall. ?X-ray shows left femur intertrochanteric fracture. ?Continue adequate pain control ?Orthopedic consulted, Patient underwent ORIF 4/26.  Postoperative day 2. ?PT recommended SNF.  Patient is being discharged to SNF for rehab ?

## 2021-11-16 NOTE — ED Provider Notes (Signed)
? ?[facility] ?Provider Note ? ? ? Event Date/Time  ? First MD Initiated Contact with Patient 11/16/21 1616   ?  (approximate) ? ? ?History  ? ?Fall ? ? ?HPI ? ?Julie Davis is a 76 y.o. female  with pmh hypertension, GERD, asthma, diverticulitis who presents after a fall.  Patient tells me that she was walking when she tripped on the linoleum falling onto her buttock.  Her daughter tells me that they were able to get her back into bed but since the fall around 5 AM today she has been more altered complaining of significant pain in the left hip.  Has not been able to bear weight.  Her husband says that she did not hit her head.  Patient denies numbness tingling weakness denies chest pain shortness of breath urinary symptoms. ? ?  ? ?Past Medical History:  ?Diagnosis Date  ? Abdominal pain, LLQ   ? Abdominal pain, RUQ   ? Arthritis   ? Asthma   ? C. difficile colitis   ? Diverticulitis   ? GERD (gastroesophageal reflux disease)   ? Hypertension   ? IBS (irritable bowel syndrome)   ? Shingles   ? ? ?Patient Active Problem List  ? Diagnosis Date Noted  ? AKI (acute kidney injury) (HCC) 03/21/2020  ? Dementia (HCC) 03/21/2020  ? Symptomatic bradycardia 03/20/2020  ? Bradycardia 03/20/2020  ? Hypokalemia   ? Severe hypertension   ? Syncope   ? ? ? ?Physical Exam  ?Triage Vital Signs: ?ED Triage Vitals  ?Enc Vitals Group  ?   BP 11/16/21 1621 (!) 182/65  ?   Pulse Rate 11/16/21 1621 80  ?   Resp 11/16/21 1621 (!) 22  ?   Temp 11/16/21 1621 98.2 ?F (36.8 ?C)  ?   Temp Source 11/16/21 1621 Oral  ?   SpO2 11/16/21 1621 97 %  ?   Weight 11/16/21 1620 169 lb 15.6 oz (77.1 kg)  ?   Height --   ?   Head Circumference --   ?   Peak Flow --   ?   Pain Score --   ?   Pain Loc --   ?   Pain Edu? --   ?   Excl. in GC? --   ? ? ?Most recent vital signs: ?Vitals:  ? 11/16/21 1621  ?BP: (!) 182/65  ?Pulse: 80  ?Resp: (!) 22  ?Temp: 98.2 ?F (36.8 ?C)  ?SpO2: 97%  ? ? ? ?General: Awake, no distress.  ?CV:  Good  peripheral perfusion.  ?Resp:  Normal effort.  ?Abd:  No distention.  Abdomen is soft and nontender ?Neuro:             Awake, Alert, Oriented x 3  ?Other:  Ecchymosis over the left upper arm that is nontender no deformity ?Tenderness to palpation over the left greater trochanter, pain with range of the left hip ? ? ? ? ?ED Results / Procedures / Treatments  ?Labs ?(all labs ordered are listed, but only abnormal results are displayed) ?Labs Reviewed  ?COMPREHENSIVE METABOLIC PANEL - Abnormal; Notable for the following components:  ?    Result Value  ? Glucose, Bld 125 (*)   ? Creatinine, Ser 1.35 (*)   ? GFR, Estimated 41 (*)   ? All other components within normal limits  ?CBC WITH DIFFERENTIAL/PLATELET - Abnormal; Notable for the following components:  ? WBC 13.9 (*)   ? RBC 3.84 (*)   ?  RDW 11.4 (*)   ? Neutro Abs 11.2 (*)   ? All other components within normal limits  ?URINALYSIS, ROUTINE W REFLEX MICROSCOPIC  ? ? ? ?EKG ? ?EKG interpreted by myself shows normal sinus rhythm with left axis deviation, no acute ischemic changes ? ? ?RADIOLOGY ?I reviewed the CT scan of the brain which does not show any acute intracranial process; agree with radiology report  ? ?I reviewed the x-ray of the left hip and pelvis which show an intertrochanteric fracture of the left hip ? ? ?PROCEDURES: ? ?Critical Care performed: No ? ?Procedures ? ?The patient is on the cardiac monitor to evaluate for evidence of arrhythmia and/or significant heart rate changes. ? ? ?MEDICATIONS ORDERED IN ED: ?Medications  ?acetaminophen (TYLENOL) tablet 1,000 mg (1,000 mg Oral Given 11/16/21 1721)  ? ? ? ?IMPRESSION / MDM / ASSESSMENT AND PLAN / ED COURSE  ?I reviewed the triage vital signs and the nursing notes. ?             ?               ? ?Differential diagnosis includes, but is not limited to, hip fracture pelvis fracture, spinal compression fracture, UTI, electrolyte abnormality ? ?Patient is a 76 year old female presenting with left hip pain  after a fall.  Occurred this morning around 5 AM.  It was witnessed by her husband.  Apparently she did not hit her head.  Patient tells me she just tripped on the linoleum.  Her daughter notes that after they got her into bed she has been more altered today complaining of significant left hip pain calling her daughter to the bed frequently.  Has not been able to ambulate.  On exam she is tender over the left greater trochanter pain with range of the hip, no obvious shortening or deformity.  Has good pulses and is neurovascular intact.  Obtained CT head and C-spine which are negative for injury.  X-ray of the left hip shows an intertrochanteric fracture.  Discussed with Dr. Samuel GermanyKrasinski's orthopedics.  Will admit to the medicine service.  RecievedTylenol patient is resting comfortably. ? ?  ? ? ?FINAL CLINICAL IMPRESSION(S) / ED DIAGNOSES  ? ?Final diagnoses:  ?Closed nondisplaced intertrochanteric fracture of left femur, initial encounter (HCC)  ? ? ? ?Rx / DC Orders  ? ?ED Discharge Orders   ? ? None  ? ?  ? ? ? ?Note:  This document was prepared using Dragon voice recognition software and may include unintentional dictation errors. ?  ?Georga HackingMcHugh, Brynnlee Cumpian Rose, MD ?11/16/21 1810 ? ?

## 2021-11-17 ENCOUNTER — Other Ambulatory Visit: Payer: Self-pay

## 2021-11-17 ENCOUNTER — Inpatient Hospital Stay: Payer: Medicare HMO

## 2021-11-17 ENCOUNTER — Encounter: Payer: Self-pay | Admitting: Internal Medicine

## 2021-11-17 ENCOUNTER — Inpatient Hospital Stay: Payer: Medicare HMO | Admitting: Certified Registered"

## 2021-11-17 ENCOUNTER — Encounter
Admission: EM | Disposition: A | Discharge status: Skilled Nursing Facility | Payer: Self-pay | Source: Home / Self Care | Attending: Family Medicine

## 2021-11-17 DIAGNOSIS — S72002A Fracture of unspecified part of neck of left femur, initial encounter for closed fracture: Secondary | ICD-10-CM | POA: Diagnosis not present

## 2021-11-17 HISTORY — PX: INTRAMEDULLARY (IM) NAIL INTERTROCHANTERIC: SHX5875

## 2021-11-17 LAB — CBC
HCT: 35.5 % — ABNORMAL LOW (ref 36.0–46.0)
Hemoglobin: 11.9 g/dL — ABNORMAL LOW (ref 12.0–15.0)
MCH: 31.7 pg (ref 26.0–34.0)
MCHC: 33.5 g/dL (ref 30.0–36.0)
MCV: 94.7 fL (ref 80.0–100.0)
Platelets: 244 10*3/uL (ref 150–400)
RBC: 3.75 MIL/uL — ABNORMAL LOW (ref 3.87–5.11)
RDW: 11.6 % (ref 11.5–15.5)
WBC: 11.6 10*3/uL — ABNORMAL HIGH (ref 4.0–10.5)
nRBC: 0 % (ref 0.0–0.2)

## 2021-11-17 LAB — BASIC METABOLIC PANEL
Anion gap: 9 (ref 5–15)
BUN: 20 mg/dL (ref 8–23)
CO2: 23 mmol/L (ref 22–32)
Calcium: 8.8 mg/dL — ABNORMAL LOW (ref 8.9–10.3)
Chloride: 108 mmol/L (ref 98–111)
Creatinine, Ser: 1.52 mg/dL — ABNORMAL HIGH (ref 0.44–1.00)
GFR, Estimated: 35 mL/min — ABNORMAL LOW (ref >60)
Glucose, Bld: 121 mg/dL — ABNORMAL HIGH (ref 70–99)
Potassium: 3.8 mmol/L (ref 3.5–5.1)
Sodium: 140 mmol/L (ref 135–145)

## 2021-11-17 SURGERY — FIXATION, FRACTURE, INTERTROCHANTERIC, WITH INTRAMEDULLARY ROD
Anesthesia: General | Site: Hip | Laterality: Left

## 2021-11-17 MED ORDER — METHOCARBAMOL 500 MG PO TABS
500.0000 mg | ORAL_TABLET | Freq: Four times a day (QID) | ORAL | Status: DC | PRN
  Administered 2021-11-18: 500 mg via ORAL
  Filled 2021-11-17: qty 1

## 2021-11-17 MED ORDER — BUPIVACAINE HCL (PF) 0.5 % IJ SOLN
INTRAMUSCULAR | Status: DC | PRN
  Administered 2021-11-17: 2.5 mL via INTRATHECAL

## 2021-11-17 MED ORDER — LACTATED RINGERS IV SOLN: INTRAVENOUS | Status: DC

## 2021-11-17 MED ORDER — ACETAMINOPHEN 10 MG/ML IV SOLN
INTRAVENOUS | Status: DC | PRN
  Administered 2021-11-17: 1000 mg via INTRAVENOUS

## 2021-11-17 MED ORDER — ACETAMINOPHEN 10 MG/ML IV SOLN
INTRAVENOUS | Status: AC
  Filled 2021-11-17: qty 100

## 2021-11-17 MED ORDER — CEFAZOLIN SODIUM-DEXTROSE 2-4 GM/100ML-% IV SOLN
2.0000 g | INTRAVENOUS | Status: AC
  Administered 2021-11-17: 2 g via INTRAVENOUS

## 2021-11-17 MED ORDER — FENTANYL CITRATE (PF) 100 MCG/2ML IJ SOLN: 25.0000 ug | INTRAMUSCULAR | Status: DC | PRN

## 2021-11-17 MED ORDER — ONDANSETRON HCL 4 MG/2ML IJ SOLN: 4.0000 mg | Freq: Once | INTRAMUSCULAR | Status: DC | PRN

## 2021-11-17 MED ORDER — METHOCARBAMOL 1000 MG/10ML IJ SOLN
500.0000 mg | Freq: Four times a day (QID) | INTRAVENOUS | Status: DC | PRN
  Filled 2021-11-17: qty 5

## 2021-11-17 MED ORDER — POTASSIUM CHLORIDE IN NACL 20-0.45 MEQ/L-% IV SOLN
INTRAVENOUS | Status: DC
  Filled 2021-11-17 (×4): qty 1000

## 2021-11-17 MED ORDER — FENTANYL CITRATE (PF) 100 MCG/2ML IJ SOLN
INTRAMUSCULAR | Status: DC | PRN
  Administered 2021-11-17 (×2): 25 ug via INTRAVENOUS

## 2021-11-17 MED ORDER — PHENYLEPHRINE HCL-NACL 20-0.9 MG/250ML-% IV SOLN
INTRAVENOUS | Status: DC | PRN
  Administered 2021-11-17: 40 ug/min via INTRAVENOUS

## 2021-11-17 MED ORDER — ONDANSETRON HCL 4 MG PO TABS: 4.0000 mg | ORAL_TABLET | Freq: Four times a day (QID) | ORAL | Status: DC | PRN

## 2021-11-17 MED ORDER — ALUM & MAG HYDROXIDE-SIMETH 200-200-20 MG/5ML PO SUSP: 30.0000 mL | ORAL | Status: DC | PRN

## 2021-11-17 MED ORDER — ACETAMINOPHEN 500 MG PO TABS
500.0000 mg | ORAL_TABLET | Freq: Four times a day (QID) | ORAL | Status: AC
  Administered 2021-11-17 – 2021-11-18 (×3): 500 mg via ORAL
  Filled 2021-11-17 (×4): qty 1

## 2021-11-17 MED ORDER — LACTATED RINGERS IV SOLN: INTRAVENOUS | Status: DC | PRN

## 2021-11-17 MED ORDER — ONDANSETRON HCL 4 MG/2ML IJ SOLN: 4.0000 mg | Freq: Four times a day (QID) | INTRAMUSCULAR | Status: DC | PRN

## 2021-11-17 MED ORDER — DEXAMETHASONE SODIUM PHOSPHATE 10 MG/ML IJ SOLN
INTRAMUSCULAR | Status: AC
  Filled 2021-11-17: qty 1

## 2021-11-17 MED ORDER — PHENOL 1.4 % MT LIQD: 1.0000 | OROMUCOSAL | Status: DC | PRN

## 2021-11-17 MED ORDER — HYDROCODONE-ACETAMINOPHEN 5-325 MG PO TABS
1.0000 | ORAL_TABLET | ORAL | Status: DC | PRN
  Administered 2021-11-19: 2 via ORAL
  Filled 2021-11-17 (×2): qty 2

## 2021-11-17 MED ORDER — KETAMINE HCL 50 MG/5ML IJ SOSY
PREFILLED_SYRINGE | INTRAMUSCULAR | Status: AC
Start: 2021-11-17 — End: ?
  Filled 2021-11-17: qty 5

## 2021-11-17 MED ORDER — PROPOFOL 10 MG/ML IV BOLUS
INTRAVENOUS | Status: DC | PRN
  Administered 2021-11-17 (×2): 20 mg via INTRAVENOUS

## 2021-11-17 MED ORDER — SENNA 8.6 MG PO TABS
1.0000 | ORAL_TABLET | Freq: Two times a day (BID) | ORAL | Status: DC
  Administered 2021-11-17 – 2021-11-19 (×4): 8.6 mg via ORAL
  Filled 2021-11-17 (×4): qty 1

## 2021-11-17 MED ORDER — ONDANSETRON HCL 4 MG/2ML IJ SOLN
INTRAMUSCULAR | Status: DC | PRN
  Administered 2021-11-17: 4 mg via INTRAVENOUS

## 2021-11-17 MED ORDER — ENOXAPARIN SODIUM 40 MG/0.4ML IJ SOSY
40.0000 mg | PREFILLED_SYRINGE | INTRAMUSCULAR | Status: DC
  Administered 2021-11-18 – 2021-11-19 (×2): 40 mg via SUBCUTANEOUS
  Filled 2021-11-17 (×2): qty 0.4

## 2021-11-17 MED ORDER — BISACODYL 10 MG RE SUPP: 10.0000 mg | Freq: Every day | RECTAL | Status: DC | PRN

## 2021-11-17 MED ORDER — ACETAMINOPHEN 10 MG/ML IV SOLN: 1000.0000 mg | Freq: Once | INTRAVENOUS | Status: DC | PRN

## 2021-11-17 MED ORDER — MENTHOL 3 MG MT LOZG: 1.0000 | LOZENGE | OROMUCOSAL | Status: DC | PRN

## 2021-11-17 MED ORDER — PROPOFOL 500 MG/50ML IV EMUL
INTRAVENOUS | Status: DC | PRN
Start: 2021-11-17 — End: 2021-11-17
  Administered 2021-11-17: 50 ug/kg/min via INTRAVENOUS

## 2021-11-17 MED ORDER — CEFAZOLIN SODIUM-DEXTROSE 2-4 GM/100ML-% IV SOLN
2.0000 g | Freq: Four times a day (QID) | INTRAVENOUS | Status: AC
  Administered 2021-11-17 – 2021-11-18 (×2): 2 g via INTRAVENOUS
  Filled 2021-11-17 (×3): qty 100

## 2021-11-17 MED ORDER — LIDOCAINE HCL (CARDIAC) PF 100 MG/5ML IV SOSY
PREFILLED_SYRINGE | INTRAVENOUS | Status: DC | PRN
  Administered 2021-11-17: 50 mg via INTRAVENOUS

## 2021-11-17 MED ORDER — TRAMADOL HCL 50 MG PO TABS
50.0000 mg | ORAL_TABLET | Freq: Four times a day (QID) | ORAL | Status: DC
  Administered 2021-11-17 – 2021-11-19 (×8): 50 mg via ORAL
  Filled 2021-11-17 (×8): qty 1

## 2021-11-17 MED ORDER — 0.9 % SODIUM CHLORIDE (POUR BTL) OPTIME
TOPICAL | Status: DC | PRN
  Administered 2021-11-17: 500 mL

## 2021-11-17 MED ORDER — CEFAZOLIN SODIUM-DEXTROSE 2-4 GM/100ML-% IV SOLN
INTRAVENOUS | Status: AC
  Filled 2021-11-17: qty 100

## 2021-11-17 MED ORDER — LIDOCAINE HCL (PF) 2 % IJ SOLN
INTRAMUSCULAR | Status: AC
Start: 2021-11-17 — End: ?
  Filled 2021-11-17: qty 5

## 2021-11-17 MED ORDER — OXYCODONE HCL 5 MG/5ML PO SOLN: 5.0000 mg | Freq: Once | ORAL | Status: DC | PRN

## 2021-11-17 MED ORDER — DEXAMETHASONE SODIUM PHOSPHATE 10 MG/ML IJ SOLN
INTRAMUSCULAR | Status: DC | PRN
  Administered 2021-11-17: 10 mg via INTRAVENOUS

## 2021-11-17 MED ORDER — STERILE WATER FOR IRRIGATION IR SOLN
Status: DC | PRN
  Administered 2021-11-17: 2000 mL

## 2021-11-17 MED ORDER — DOCUSATE SODIUM 100 MG PO CAPS
100.0000 mg | ORAL_CAPSULE | Freq: Two times a day (BID) | ORAL | Status: DC
  Administered 2021-11-17 – 2021-11-19 (×4): 100 mg via ORAL
  Filled 2021-11-17 (×4): qty 1

## 2021-11-17 MED ORDER — KETAMINE HCL 10 MG/ML IJ SOLN
INTRAMUSCULAR | Status: DC | PRN
  Administered 2021-11-17 (×3): 10 mg via INTRAVENOUS

## 2021-11-17 MED ORDER — MORPHINE SULFATE (PF) 2 MG/ML IV SOLN: 0.5000 mg | INTRAVENOUS | Status: DC | PRN

## 2021-11-17 MED ORDER — OXYCODONE HCL 5 MG PO TABS: 5.0000 mg | ORAL_TABLET | Freq: Once | ORAL | Status: DC | PRN

## 2021-11-17 MED ORDER — ONDANSETRON HCL 4 MG/2ML IJ SOLN
INTRAMUSCULAR | Status: AC
  Filled 2021-11-17: qty 2

## 2021-11-17 MED ORDER — DEXMEDETOMIDINE (PRECEDEX) IN NS 20 MCG/5ML (4 MCG/ML) IV SYRINGE
PREFILLED_SYRINGE | INTRAVENOUS | Status: DC | PRN
  Administered 2021-11-17 (×2): 4 ug via INTRAVENOUS

## 2021-11-17 MED ORDER — FENTANYL CITRATE (PF) 100 MCG/2ML IJ SOLN
INTRAMUSCULAR | Status: AC
  Filled 2021-11-17: qty 2

## 2021-11-17 SURGICAL SUPPLY — 39 items
BNDG COHESIVE 6X5 TAN ST LF (GAUZE/BANDAGES/DRESSINGS) ×4 IMPLANT
DRAPE 3/4 80X56 (DRAPES) ×4 IMPLANT
DRAPE SURG 17X11 SM STRL (DRAPES) ×4 IMPLANT
DRAPE U-SHAPE 47X51 STRL (DRAPES) ×4 IMPLANT
DRSG OPSITE POSTOP 3X4 (GAUZE/BANDAGES/DRESSINGS) ×4 IMPLANT
DRSG OPSITE POSTOP 4X14 (GAUZE/BANDAGES/DRESSINGS) IMPLANT
DRSG OPSITE POSTOP 4X6 (GAUZE/BANDAGES/DRESSINGS) ×2 IMPLANT
DURAPREP 26ML APPLICATOR (WOUND CARE) ×4 IMPLANT
ELECT REM PT RETURN 9FT ADLT (ELECTROSURGICAL) ×2
ELECTRODE REM PT RTRN 9FT ADLT (ELECTROSURGICAL) ×1 IMPLANT
GAUZE XEROFORM 1X8 LF (GAUZE/BANDAGES/DRESSINGS) ×1 IMPLANT
GLOVE SURG ORTHO LTX SZ9 (GLOVE) ×4 IMPLANT
GLOVE SURG UNDER POLY LF SZ9 (GLOVE) ×2 IMPLANT
GOWN STRL REUS TWL 2XL XL LVL4 (GOWN DISPOSABLE) ×2 IMPLANT
GOWN STRL REUS W/ TWL LRG LVL3 (GOWN DISPOSABLE) ×1 IMPLANT
GOWN STRL REUS W/TWL LRG LVL3 (GOWN DISPOSABLE) ×1
GUIDEPIN VERSANAIL DSP 3.2X444 (ORTHOPEDIC DISPOSABLE SUPPLIES) ×1 IMPLANT
GUIDEWIRE BALL NOSE 100CM (WIRE) ×1 IMPLANT
HEMOVAC 400CC 10FR (MISCELLANEOUS) IMPLANT
HIP FRA NAIL LAG SCREW 10.5X90 (Orthopedic Implant) ×2 IMPLANT
KIT TURNOVER CYSTO (KITS) ×2 IMPLANT
MANIFOLD NEPTUNE II (INSTRUMENTS) ×2 IMPLANT
MAT ABSORB  FLUID 56X50 GRAY (MISCELLANEOUS) ×1
MAT ABSORB FLUID 56X50 GRAY (MISCELLANEOUS) ×1 IMPLANT
NAIL HIP FRACT 130D 11X180 (Screw) ×1 IMPLANT
NS IRRIG 500ML POUR BTL (IV SOLUTION) ×2 IMPLANT
PACK HIP COMPR (MISCELLANEOUS) ×2 IMPLANT
PAD ARMBOARD 7.5X6 YLW CONV (MISCELLANEOUS) ×2 IMPLANT
SCREW BONE CORTICAL 5.0X32 (Screw) ×1 IMPLANT
SCREW LAG HIP FRA NAIL 10.5X90 (Orthopedic Implant) IMPLANT
STAPLER SKIN PROX 35W (STAPLE) ×2 IMPLANT
SUCTION FRAZIER HANDLE 10FR (MISCELLANEOUS) ×1
SUCTION TUBE FRAZIER 10FR DISP (MISCELLANEOUS) ×1 IMPLANT
SUT VIC AB 0 CT1 36 (SUTURE) ×4 IMPLANT
SUT VIC AB 2-0 CT1 27 (SUTURE) ×1
SUT VIC AB 2-0 CT1 TAPERPNT 27 (SUTURE) ×1 IMPLANT
SUT VICRYL 0 AB UR-6 (SUTURE) ×2 IMPLANT
SYR 30ML LL (SYRINGE) ×2 IMPLANT
WATER STERILE IRR 500ML POUR (IV SOLUTION) ×2 IMPLANT

## 2021-11-17 NOTE — Anesthesia Procedure Notes (Signed)
Spinal ? ?Patient location during procedure: OR ?Start time: 11/17/2021 3:40 PM ?End time: 11/17/2021 3:52 PM ?Reason for block: surgical anesthesia ?Staffing ?Performed: anesthesiologist  ?Anesthesiologist: Darrin Nipper, MD ?Preanesthetic Checklist ?Completed: patient identified, IV checked, site marked, risks and benefits discussed, surgical consent, monitors and equipment checked, pre-op evaluation and timeout performed ?Spinal Block ?Patient position: right lateral decubitus ?Prep: ChloraPrep ?Patient monitoring: heart rate, continuous pulse ox and blood pressure ?Approach: left paramedian ?Location: L2-3 ?Injection technique: single-shot ?Needle ?Needle type: [place]  ?Needle gauge: 22 G ?Needle length: 9 cm ?Assessment ?Sensory level: T4 ?Events: CSF return ?Additional Notes ?Difficult placement; little feedback with spinal needle; encountered deep os at most passes.  Ultimately placed via left paramedian approach. ? ? ? ?

## 2021-11-17 NOTE — Consult Note (Signed)
?ORTHOPAEDIC CONSULTATION ? ?REQUESTING PHYSICIAN: Shawna Clamp, MD ? ?Chief Complaint: Left hip pain status post fall ? ?HPI: ?Julie Davis is a 76 y.o. female who complains of left hip pain after mechanical fall.  She does not recall the exact mechanism or location of her fall.  Patient was seen in the ER this morning in room 33 and again this afternoon in the preoperative area with her daughter at the bedside.  Patient denies significant left hip pain currently.  Upon arrival to the emergency department the patient was diagnosed with a displaced left intertrochanteric hip fracture.  She was admitted to the hospital service for medical clearance and orthopedics has been consulted for management of her fracture. ? ?Past Medical History:  ?Diagnosis Date  ? Abdominal pain, LLQ   ? Abdominal pain, RUQ   ? Arthritis   ? Asthma   ? C. difficile colitis   ? Diverticulitis   ? GERD (gastroesophageal reflux disease)   ? Hypertension   ? IBS (irritable bowel syndrome)   ? Shingles   ? ?Past Surgical History:  ?Procedure Laterality Date  ? BACK SURGERY    ? laminectomy posterior lumbar facetectomy  ? BREAST BIOPSY Right 07/07/2020  ? affirm bx calcs, x marker, FIBROCYSTIC CHANGES WITH ASSOCIATED CALCIFICATIONS  ? BREAST BIOPSY WITH RADIO FREQUENCY LOCALIZER Right 08/05/2020  ? Procedure: BREAST BIOPSY WITH RADIO FREQUENCY LOCALIZER;  Surgeon: Herbert Pun, MD;  Location: [facility] ORS;  Service: General;  Laterality: Right;  ? CATARACT EXTRACTION, BILATERAL    ? CHOLECYSTECTOMY    ? COLONOSCOPY  10/2013  ? COLONOSCOPY WITH PROPOFOL N/A 06/25/2018  ? Procedure: COLONOSCOPY WITH PROPOFOL;  Surgeon: Manya Silvas, MD;  Location: [facility] ENDOSCOPY;  Service: Endoscopy;  Laterality: N/A;  ? ESOPHAGOGASTRODUODENOSCOPY (EGD) WITH PROPOFOL N/A 06/25/2018  ? Procedure: ESOPHAGOGASTRODUODENOSCOPY (EGD) WITH PROPOFOL;  Surgeon: Manya Silvas, MD;  Location: [facility] ENDOSCOPY;  Service: Endoscopy;  Laterality: N/A;  ? EYE  SURGERY    ? ?Social History  ? ?Socioeconomic History  ? Marital status: Married  ?  Spouse name: Not on file  ? Number of children: Not on file  ? Years of education: Not on file  ? Highest education level: Not on file  ?Occupational History  ? Not on file  ?Tobacco Use  ? Smoking status: Former  ? Smokeless tobacco: Never  ?Vaping Use  ? Vaping Use: Never used  ?Substance and Sexual Activity  ? Alcohol use: Never  ? Drug use: Never  ? Sexual activity: Not Currently  ?Other Topics Concern  ? Not on file  ?Social History Narrative  ? Not on file  ? ?Social Determinants of Health  ? ?Financial Resource Strain: Not on file  ?Food Insecurity: Not on file  ?Transportation Needs: Not on file  ?Physical Activity: Not on file  ?Stress: Not on file  ?Social Connections: Not on file  ? ?Family History  ?Problem Relation Age of Onset  ? Breast cancer Father 76  ? ?Allergies  ?Allergen Reactions  ? Methotrexate Derivatives Diarrhea  ? ?Prior to Admission medications   ?Medication Sig Start Date End Date Taking? Authorizing Provider  ?ALPRAZolam (XANAX) 0.5 MG tablet Take 0.5 mg by mouth 2 (two) times daily as needed for anxiety.    Yes [provider]  ?amLODipine (NORVASC) 10 MG tablet Take 10 mg by mouth daily.   Yes [provider]  ?atorvastatin (LIPITOR) 10 MG tablet Take 10 mg by mouth daily.   Yes [provider]  ?ergocalciferol (VITAMIN D2) 1.25 MG (50000 UT) capsule Take 50,000 Units by mouth every Monday.   Yes [provider]  ?galantamine (RAZADYNE) 4 MG tablet Take 4 mg by mouth 2 (two) times daily. 11/15/21  Yes [provider]  ?losartan (COZAAR) 25 MG tablet Take 25 mg by mouth daily. 09/04/21  Yes [provider]  ?memantine (NAMENDA) 5 MG tablet Take 5 mg by mouth 2 (two) times daily. 11/08/21  Yes [provider]  ?pantoprazole (PROTONIX) 40 MG tablet Take 40 mg by mouth daily.   Yes [provider]  ?PARoxetine (PAXIL) 20 MG tablet Take  20 mg by mouth daily. 11/03/21  Yes [provider]  ?traMADol (ULTRAM) 50 MG tablet Take 100 mg by mouth 3 (three) times daily as needed for moderate pain or severe pain.    Yes [provider]  ?vitamin B-12 (CYANOCOBALAMIN) 1000 MCG tablet Take 1,000 mcg by mouth daily.   Yes [provider]  ?vitamin k 100 MCG tablet Take 100 mcg by mouth daily.   Yes [provider]  ?donepezil (ARICEPT) 10 MG tablet Take 10 mg by mouth daily. ?Patient not taking: Reported on 11/16/2021 01/15/20   [provider]  ?escitalopram (LEXAPRO) 10 MG tablet Take 10 mg by mouth daily. ?Patient not taking: Reported on 11/16/2021    [provider]  ? ?Welcome (5MM) ? ?Result Date: 11/16/2021 ?CLINICAL DATA:  Fall EXAM: CT HEAD WITHOUT CONTRAST TECHNIQUE: Contiguous axial images were obtained from the base of the skull through the vertex without intravenous contrast. RADIATION DOSE REDUCTION: This exam was performed according to the departmental dose-optimization program which includes automated exposure control, adjustment of the mA and/or kV according to patient size and/or use of iterative reconstruction technique. COMPARISON:  None. FINDINGS: Brain: No acute intracranial hemorrhage. No focal mass lesion. No CT evidence of acute infarction. No midline shift or mass effect. No hydrocephalus. Basilar cisterns are patent. There are periventricular and subcortical white matter hypodensities. Generalized cortical atrophy. Ex vacuo dilatation of the LEFT ventricle atria unchanged from prior. Vascular: No hyperdense vessel or unexpected calcification. Skull: Normal. Negative for fracture or focal lesion. Sinuses/Orbits: Paranasal sinuses and mastoid air cells are clear. Orbits are clear. Other: None. IMPRESSION: 1. No acute intracranial findings. 2. 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 Electronically Signed   By: Suzy Bouchard M.D.   On: 11/16/2021 17:37   ? ?CT Cervical Spine Wo Contrast ? ?Result Date: 11/16/2021 ?CLINICAL DATA:  Fall, neck trauma EXAM: CT CERVICAL SPINE WITHOUT CONTRAST TECHNIQUE: Multidetector CT imaging of the cervical spine was performed without intravenous contrast. Multiplanar CT image reconstructions were also generated. RADIATION DOSE REDUCTION: This exam was performed according to the departmental dose-optimization program which includes automated exposure control, adjustment of the mA and/or kV according to patient size and/or use of iterative reconstruction technique. COMPARISON:  None. FINDINGS: Alignment: There is straightening of the cervical spine. 2 mm anterolisthesis C4-5 is likely degenerative in nature. Skull base and vertebrae: No acute fracture of the cervical spine. Vertebral body height is preserved. Craniocervical alignment is normal. Landau dental interval is not widened. 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 Mild flattening of the thecal sac no canal hematoma. No prevertebral soft tissue swelling. No paravertebral fluid collections. Extensive right and moderate left carotid bifurcation calcifications. No pathologic  cervical adenopathy. Visualized salivary glands are unremarkable. Thyroid gland unremarkable. Disc levels: There is intervertebral disc space narrowing and endplate remodeling at 579FGE, most severe at C6-7 in keeping with changes of mild to moderate degenerative disc disease. Prevertebral soft tissues are not thickened. Review of the axial images demonstrates multilevel uncovertebral and facet arthrosis resulting in mild-to-moderate left neuroforaminal narrowing at C6-7. Upper chest: Negative. Other: None IMPRESSION: No acute fracture or listhesis of the cervical spine. Ossification of the posterior longitudinal ligament in combination with degenerative  changes resulting in moderate central canal stenosis at C5-6. Degenerative disc and degenerative joint disease as described above resulting in mild-to-moderate left neuroforaminal narrowing at C6-7. Peripheral va

## 2021-11-17 NOTE — ED Notes (Signed)
Pt changed, linens changed, and new brief placed on patient.  ?

## 2021-11-17 NOTE — Transfer of Care (Signed)
Immediate Anesthesia Transfer of Care Note  Patient: DANIKAH STREBE  Procedure(s) Performed: INTRAMEDULLARY (IM) NAIL INTERTROCHANTRIC (Left: Hip)  Patient Location: PACU  Anesthesia Type:General and Spinal  Level of Consciousness: drowsy and patient cooperative  Airway & Oxygen Therapy: Pt breathing spontaneously via facemask.   Post-op Assessment: Report given to RN and Post -op Vital signs reviewed and stable  Post vital signs: Reviewed and stable  Last Vitals:  Vitals Value Taken Time  BP 127/98 11/17/21 1712  Temp    Pulse 62 11/17/21 1714  Resp 20 11/17/21 1717  SpO2 99 % 11/17/21 1714  Vitals shown include unvalidated device data.  Last Pain:  Vitals:   11/17/21 1411  TempSrc: Oral  PainSc: 0-No pain         Complications: No notable events documented.

## 2021-11-17 NOTE — Progress Notes (Signed)
To whom it may concern: ? ?Please be advised that the above named patient has a primary diagnosis of dementia which supersedes any other mental health diagnosis.  ?

## 2021-11-17 NOTE — Progress Notes (Signed)
Admission profile updated. ?

## 2021-11-17 NOTE — Op Note (Signed)
DATE OF SURGERY:  11/17/2021 ? ?TIME: 5:26 PM ? ?PATIENT NAME:  Julie Davis ? ?AGE: 76 y.o. ? ?PRE-OPERATIVE DIAGNOSIS:  Left hip intertrochanteric fracture ? ?POST-OPERATIVE DIAGNOSIS:  SAME ? ?PROCEDURE: Intramedullary fixation for left intertrochanteric hip fracture ? ?SURGEON:  Juanell Fairly ? ?OPERATIVE IMPLANTS: Biomet short Affixus nail 11 x 380 mm, 90 mm lag screw with a 32 mm distal interlocking screw ? ?PREOPERATIVE INDICATIONS:  Julie Davis is a 76 y.o. year old who fell and suffered a hip fracture. She was brought into the ER and then admitted and medically cleared for surgical intervention.   ? ?The risks, benefits and alternatives were discussed with the patient and their family.  The risks include but are not limited to infection, bleeding, nerve or blood vessel injury, malunion, nonunion, hardware prominence, hardware failure, change in leg lengths or lower extremity rotation need for further surgery including hardware removal with conversion to a total hip arthroplasty. Medical risks include but are not limited to DVT and pulmonary embolism, myocardial infarction, stroke, pneumonia, respiratory failure and death. The patient and their family understood these risks and wished to proceed with surgery. ? ?OPERATIVE PROCEDURE: ? ?The patient was brought to the operating room and placed in the supine position on the fracture table.  Spinal anesthesia was administered.  A time out was performed to verify the patient's name, date of birth, medical record number, correct site of surgery correct procedure to be performed. The timeout was also used to verify the patient received antibiotics and all appropriate instruments, implants and radiographic studies were available in the room. Once all in attendance were in agreement, the case began. A closed reduction was performed under C-arm guidance.  The fracture reduction was confirmed on both AP and lateral views.  The patient was prepped and draped  in a sterile fashion. She received preoperative antibiotics with 2 g of Ancef IV. ? ?An incision was made proximal to the greater trochanter in line with the femur. A guidewire was placed over the tip of the greater trochanter and advanced into the proximal femur to the level of the lesser trochanter.  Confirmation of the drill pin position was made on AP and lateral C-arm images.  The threaded guidepin was then overdrilled with the proximal femoral drill.  The nail was then inserted into the proximal femur, across the fracture site and into the femoral shaft. Its position was confirmed on AP and lateral C-arm images.  ? ?Once the nail was completely seated, the drill guide for the lag screw was placed through the guide arm for the Affixus nail. A guidepin was then placed through this drill guide and advanced through the lateral cortex of the femur, across the fracture site and into the femoral head achieving a tip apex distance of less than 25 mm. The depth of the drill pin was measured, and then the drill for the lag screw was advanced over the guidepin and through the lateral cortex, across the fracture site and up into the femoral head to the depth previously measured.  The lag screw was then advanced by hand into position across the fracture site into the femoral head. Its final position was confirmed on AP and lateral C-arm images. Compression was applied as traction was carefully released. The set screw in the top of the intramedullary rod was tightened by hand using a screwdriver. It was backed off a quarter turn to allow for compression at the fracture site. ? ?The drill sleeve for  the distal interlocking screw was then placed through the Affixus guide arm. A small stab incision was made to allow the drill guide to approximate the lateral cortex of the femur. The drill for the distal interlocking screw was then advanced bicortically. The depth of this drill was measured. A distal interlocking screw with the  length of 32 was then inserted by hand through the guide arm. Final C-arm images of the entire intramedullary construct were taken in both the AP and lateral planes.  ? ?The wounds were irrigated copiously and closed with 0 Vicryl for closure of the deep fascia and 2-0 Vicryl for subcutaneous closure. The skin was approximated with staples. A dry sterile dressing was applied. I was scrubbed and present the entire case and all sharp and instrument counts were correct at the conclusion of the case. Patient was transferred to hospital bed and brought to PACU in stable condition. I spoke with the patient's daughter postoperatively to let her know the case had been performed without complication and the patient was stable in the recovery room. ? ? ? ?Kathreen Devoid, MD  ?

## 2021-11-17 NOTE — Progress Notes (Signed)
To whom it may concern: ? ?Please be advised that the above named patient will requiring a short term nursing home stay - anticipated 30 days or less, for strengthening and rehabilitation.   ?

## 2021-11-17 NOTE — [place] FL2 (Signed)
?[place] MEDICAID FL2 LEVEL OF CARE SCREENING TOOL  ?  ? ?IDENTIFICATION  ?Patient Name: ?Julie Davis Birthdate: September 02, 1945 Sex: female Admission Date (Current Location): ?11/16/2021  ?[place] Number: ? [place] ?  Facility and Address:  ?[facility], [address], [place], [place] [address] ?     Provider Number: ?[id number]  ?Attending Physician Name and Address:  ?Cipriano Bunker, MD ? Relative Name and Phone Number:  ?Alenis Tryba- daughter- [phone] ?   ?Current Level of Care: ?Hospital Recommended Level of Care: ?Skilled Nursing Facility Prior Approval Number: ?  ? ?Date Approved/Denied: ?  PASRR Number: ?pending ? ?Discharge Plan: ?SNF ?  ? ?Current Diagnoses: ?Patient Active Problem List  ? Diagnosis Date Noted  ? Closed left hip fracture (HCC) 11/16/2021  ? Leukocytosis 11/16/2021  ? Essential hypertension 11/16/2021  ? Depression with anxiety 11/16/2021  ? Stage 3b chronic kidney disease (CKD) (HCC) 11/16/2021  ? AKI (acute kidney injury) (HCC) 03/21/2020  ? Dementia (HCC) 03/21/2020  ? Symptomatic bradycardia 03/20/2020  ? Bradycardia 03/20/2020  ? Hypokalemia   ? Severe hypertension   ? Syncope   ? ? ?Orientation RESPIRATION BLADDER Height & Weight   ?  ?Self ? Normal Incontinent Weight: 77.1 kg ?Height:     ?BEHAVIORAL SYMPTOMS/MOOD NEUROLOGICAL BOWEL NUTRITION STATUS  ?    Continent Diet (see discharge summary)  ?AMBULATORY STATUS COMMUNICATION OF NEEDS Skin   ?Extensive Assist Verbally Surgical wounds (incision left hip) ?  ?  ?  ?    ?     ?     ? ? ?Personal Care Assistance Level of Assistance  ?Bathing, Feeding, Dressing Bathing Assistance: Limited assistance ?Feeding assistance: Limited assistance ?Dressing Assistance: Limited assistance ?   ? ?Functional Limitations Info  ?Sight, Hearing, Speech Sight Info: Adequate ?Hearing Info: Adequate ?Speech Info: Adequate  ? ? ?SPECIAL CARE FACTORS FREQUENCY  ?PT (By licensed PT), OT (By licensed OT)   ?  ?PT  Frequency: 5 times per week ?OT Frequency: 5 times per week ?  ?  ?  ?   ? ? ?Contractures Contractures Info: Not present  ? ? ?Additional Factors Info  ?Code Status, Allergies Code Status Info: Full ?Allergies Info: methotrexate derivatives ?  ?  ?  ?   ? ?Current Medications (11/17/2021):  This is the current hospital active medication list ?Current Facility-Administered Medications  ?Medication Dose Route Frequency Provider Last Rate Last Admin  ? acetaminophen (TYLENOL) tablet 650 mg  650 mg Oral Q6H PRN Cox, Amy N, DO      ? Or  ? acetaminophen (TYLENOL) suppository 650 mg  650 mg Rectal Q4H PRN Cox, Amy N, DO      ? ALPRAZolam (XANAX) tablet 0.5 mg  0.5 mg Oral BID PRN Cox, Amy N, DO      ? amLODipine (NORVASC) tablet 10 mg  10 mg Oral Daily Cox, Amy N, DO   10 mg at 11/17/21 1028  ? atorvastatin (LIPITOR) tablet 10 mg  10 mg Oral Daily Cox, Amy N, DO   10 mg at 11/16/21 2131  ? galantamine (RAZADYNE) tablet 4 mg  4 mg Oral BID Cox, Amy N, DO   4 mg at 11/17/21 1028  ? HYDROcodone-acetaminophen (NORCO/VICODIN) 5-325 MG per tablet 1-2 tablet  1-2 tablet Oral Q6H PRN Cox, Amy N, DO   2 tablet at 11/16/21 2130  ? losartan (COZAAR) tablet 25 mg  25 mg Oral Daily Cox, Amy N, DO  25 mg at 11/17/21 1028  ? memantine (NAMENDA) tablet 5 mg  5 mg Oral BID Cox, Amy N, DO   5 mg at 11/17/21 1028  ? morphine (PF) 2 MG/ML injection 0.5 mg  0.5 mg Intravenous Q2H PRN Cox, Amy N, DO      ? pantoprazole (PROTONIX) EC tablet 40 mg  40 mg Oral Daily Cox, Amy N, DO   40 mg at 11/17/21 1028  ? PARoxetine (PAXIL) tablet 20 mg  20 mg Oral Daily Cox, Amy N, DO   20 mg at 11/17/21 1029  ? polyethylene glycol (MIRALAX / GLYCOLAX) packet 17 g  17 g Oral Daily PRN Cox, Amy N, DO      ? vitamin B-12 (CYANOCOBALAMIN) tablet 1,000 mcg  1,000 mcg Oral Daily Cox, Amy N, DO   1,000 mcg at 11/17/21 1029  ? ?Current Outpatient Medications  ?Medication Sig Dispense Refill  ? ALPRAZolam (XANAX) 0.5 MG tablet Take 0.5 mg by mouth 2 (two) times  daily as needed for anxiety.     ? amLODipine (NORVASC) 10 MG tablet Take 10 mg by mouth daily.    ? atorvastatin (LIPITOR) 10 MG tablet Take 10 mg by mouth daily.    ? ergocalciferol (VITAMIN D2) 1.25 MG (50000 UT) capsule Take 50,000 Units by mouth every Monday.    ? galantamine (RAZADYNE) 4 MG tablet Take 4 mg by mouth 2 (two) times daily.    ? losartan (COZAAR) 25 MG tablet Take 25 mg by mouth daily.    ? memantine (NAMENDA) 5 MG tablet Take 5 mg by mouth 2 (two) times daily.    ? pantoprazole (PROTONIX) 40 MG tablet Take 40 mg by mouth daily.    ? PARoxetine (PAXIL) 20 MG tablet Take 20 mg by mouth daily.    ? traMADol (ULTRAM) 50 MG tablet Take 100 mg by mouth 3 (three) times daily as needed for moderate pain or severe pain.     ? vitamin B-12 (CYANOCOBALAMIN) 1000 MCG tablet Take 1,000 mcg by mouth daily.    ? vitamin k 100 MCG tablet Take 100 mcg by mouth daily.    ? donepezil (ARICEPT) 10 MG tablet Take 10 mg by mouth daily. (Patient not taking: Reported on 11/16/2021)    ? escitalopram (LEXAPRO) 10 MG tablet Take 10 mg by mouth daily. (Patient not taking: Reported on 11/16/2021)    ? ? ? ?Discharge Medications: ?Please see discharge summary for a list of discharge medications. ? ?Relevant Imaging Results: ? ?Relevant Lab Results: ? ? ?Additional Information ?SS# [id number] ? ?Shelbie Hutching, RN ? ? ? ? ?

## 2021-11-17 NOTE — Anesthesia Procedure Notes (Signed)
Date/Time: 11/17/2021 3:35 PM Performed by: Reed Breech, MD Pre-anesthesia Checklist: Patient identified, Emergency Drugs available, Suction available, Patient being monitored and Timeout performed Oxygen Delivery Method: Simple face mask

## 2021-11-17 NOTE — TOC Initial Note (Signed)
Transition of Care (TOC) - Initial/Assessment Note  ? ? ?Patient Details  ?Name: Julie Davis ?MRN: [id number] ?Date of Birth: 1945-09-10 ? ?Transition of Care (TOC) CM/SW Contact:    ?Shelbie Hutching, RN ?Phone Number: ?11/17/2021, 10:51 AM ? ?Clinical Narrative:                 ?Patient admitted to the hospital after a fall and sustaining a left hip fracture.  Patient will go to the OR today.  RNCM met with patient at the bedside, introduced self and explained role, patient is pleasantly confused, she is able to report that she lives at home with her daughter and husband and usually gets around with no assistive devices.   ?Spoke with patient's daughter via phone, patient has dementia.  Daughter provides all transportation for patient and her husband, she has frequent urinary incontinence which causes her to be up and down a lot at night, she has a walker at home but does not usually use it.   ?Patient has never been to rehab before but daughter is in agreement with that plan after surgery.  RNCM will begin bed search.    ? ?Expected Discharge Plan: [place] ?Barriers to Discharge: Continued Medical Work up ? ? ?Patient Goals and CMS Choice ?Patient states their goals for this hospitalization and ongoing recovery are:: patient confused- daughter would agree to SNF at [facility] for rehab ?CMS Medicare.gov Compare Post Acute Care list provided to:: Patient Represenative (must comment) ?Choice offered to / list presented to : Adult Children ? ?Expected Discharge Plan and Services ?Expected Discharge Plan: [employer] ?  ?Discharge Planning Services: CM Consult ?Post Acute Care Choice: [place] [facility] ?Living arrangements for the past 2 months: [place] ?                ?DME Arranged: N/A ?DME Agency: NA ?  ?  ?  ?HH Arranged: NA ?[place] Agency: NA ?  ?  ?  ? ?Prior Living Arrangements/Services ?Living arrangements for the past 2 months: [place] ?Lives with:: Adult  Children, Spouse ?Patient language and need for interpreter reviewed:: Yes ?Do you feel safe going back to the place where you live?: Yes      ?Need for Family Participation in Patient Care: Yes (Comment) ?Care giver support system in place?: Yes (comment) (daughter) ?Current home services: DME (rolling walker) ?Criminal Activity/Legal Involvement Pertinent to Current Situation/Hospitalization: No - Comment as needed ? ?Activities of Daily Living ?Home Assistive Devices/Equipment: None ?ADL Screening (condition at time of admission) ?Patient's cognitive ability adequate to safely complete daily activities?: No ?Is the patient deaf or have difficulty hearing?: No ?Does the patient have difficulty seeing, even when wearing glasses/contacts?: No ?Does the patient have difficulty concentrating, remembering, or making decisions?: Yes ?Patient able to express need for assistance with ADLs?: Yes ?Does the patient have difficulty dressing or bathing?: Yes ?Independently performs ADLs?: No ?Communication: Independent ?Dressing (OT): Needs assistance ?Is this a change from baseline?: Change from baseline, expected to last <3days ?Grooming: Independent ?Feeding: Independent ?Bathing: Needs assistance ?Is this a change from baseline?: Change from baseline, expected to last <3 days ?Toileting: Needs assistance ?Is this a change from baseline?: Change from baseline, expected to last <3 days ?In/Out Bed: Needs assistance ?Is this a change from baseline?: Change from baseline, expected to last <3 days ?Walks in Home: Needs assistance ?Is this a change from baseline?: Change from baseline, expected to last <3 days ?Does  the patient have difficulty walking or climbing stairs?: Yes ?Weakness of Legs: Left ?Weakness of Arms/Hands: None ? ?Permission Sought/Granted ?Permission sought to share information with : Case Manager, Customer service manager, Family Supports ?Permission granted to share information with : Yes, Verbal  Permission Granted ? Share Information with NAME: Juniper Cobey ? Permission granted to share info w AGENCY: SNF's ? Permission granted to share info w Relationship: daughter ? Permission granted to share info w Contact Information: [phone] ? ?Emotional Assessment ?Appearance:: Appears stated age ?Attitude/Demeanor/Rapport: Engaged ?Affect (typically observed): Accepting ?Orientation: : Oriented to Self ?Alcohol / Substance Use: Not Applicable ?Psych Involvement: No (comment) ? ?Admission diagnosis:  Closed left hip fracture ([place]) [S72.002A] ?Patient Active Problem List  ? Diagnosis Date Noted  ? Closed left hip fracture ([place]) 11/16/2021  ? Leukocytosis 11/16/2021  ? Essential hypertension 11/16/2021  ? Depression with anxiety 11/16/2021  ? Stage 3b chronic kidney disease (CKD) ([place]) 11/16/2021  ? AKI (acute kidney injury) ([place]) 03/21/2020  ? Dementia ([place]) 03/21/2020  ? Symptomatic bradycardia 03/20/2020  ? Bradycardia 03/20/2020  ? Hypokalemia   ? Severe hypertension   ? Syncope   ? ?PCP:  Rusty Aus, MD ?Pharmacy:   ?[id number] [place] [address]?Phone: [phone]: [phone]? ? ? ? ?Social Determinants of Health (SDOH) Interventions ?  ? ?Readmission Risk Interventions ?   ? View : No data to display.  ?  ?  ?  ? ? ? ?

## 2021-11-17 NOTE — Progress Notes (Signed)
?  Progress Note ? ? ?Patient: Julie Davis [id number] DOB: 1946-01-16 DOA: 11/16/2021     1 ?DOS: the patient was seen and examined on 11/17/2021 ?  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 CT of the head showed no acute intracranial findings.  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 ?CT cervical spine without contrast: No acute fracture or listhesis of the cervical spine. ?Left complete knee x-rays: Mild tricompartment arthritis.  No acute osseous abnormality. ?Left humerus: Negative. ?Left hip x-ray: Acute slightly angulated left femoral intertrochanteric fracture. ?Patient was admitted for left intertrochanteric femur fracture, orthopedic is consulted,  Patient is scheduled to have ORIF today ? ?Assessment and Plan: ?* Closed left hip fracture ([place]) ?Patient presented s/p mechanical fall. ?X-ray shows left femur intertrochanteric fracture. ?Continue adequate pain control ?Orthopedic consulted, Patient is scheduled to have ORIF today ?TOC consulted for SNF and DME. ? ?Stage 3b chronic kidney disease (CKD) ([place]) ?Renal functions at baseline ? ?Depression with anxiety ?Continue Paxil daily and alprazolam as needed ? ?Essential hypertension ?Continue amlodipine and losartan. ? ?Leukocytosis ?Likely reactive.  Continue to monitor ? ?Dementia ([place]) ?Continue galantamine , Namenda ? ? ?Subjective: Patient was seen and examined at bedside.  Overnight events noted. ?Patient reports having mild pain but otherwise doing better.  She is going to have ORIF today. ? ?Physical Exam: ?Vitals:  ? 11/17/21 0800 11/17/21 1025 11/17/21 1315 11/17/21 1411  ?BP: (!) 143/98 (!) 181/63 (!) 168/57 (!) 173/60  ?Pulse: 81  76 75 74  ?Resp:  18 17 18   ?Temp:   98.2 ?F (36.8 ?C) 99.2 ?F (37.3 ?C)  ?TempSrc:   Oral Oral  ?SpO2: 90% 96% 98% 98%  ?Weight:    77.1 kg  ? ?General exam: Appears comfortable, not in any acute distress.  Deconditioned ?Respiratory system: CTA bilaterally, no wheezing, no crackles, normal respiratory effort. ?Cardiovascular system: S1-S2 heard, regular rate and rhythm, no murmur. ?Gastrointestinal system: Abdomen is  soft, nontender, nondistended, BS+ ?Central nervous system: Alert, oriented x 3, no focal neurological deficits. ?Extremities: Left leg tenderness, restricted movement.  No edema ?Psychiatry: Mood, insight, judgment normal. ? ?Data Reviewed: ?I have Reviewed nursing notes, Vitals, and Lab results since pt's last encounter. Pertinent lab results CBC, BMP ?I have ordered test including CBC, BMP ?I have reviewed the last note from orthopedics,  ?I have discussed pt's care plan and test results with patient.  ? ?Family Communication: No family at bedside ? ?Disposition: ?Status is: Inpatient ?Remains inpatient appropriate because:  ? ?Admitted s/p mechanical fall with left femur intertrochanteric fracture.  Orthopedics consulted patient is a scheduled for ORIF today. ? ? ? Planned Discharge Destination: SNF versus HH ? ? ? ?Time spent: 50 minutes ? ?Author: ?Shawna Clamp, MD ?11/17/2021 3:34 PM ? ?For on call review [email or web address].  ?

## 2021-11-17 NOTE — Anesthesia Preprocedure Evaluation (Addendum)
Anesthesia Evaluation  ?Patient identified by MRN, date of birth, ID band ?Patient awake ? ? ? ?Reviewed: ?Allergy & Precautions, NPO status , Patient's Chart, lab work & pertinent test results ? ?History of Anesthesia Complications ?Negative for: history of anesthetic complications ? ?Airway ?Mallampati: I ? ? ?Neck ROM: Full ? ? ? Dental ? ?(+) Edentulous Upper, Edentulous Lower ?  ?Pulmonary ?asthma , former smoker (quit greater than 30 years ago),  ?  ?Pulmonary exam normal ?breath sounds clear to auscultation ? ? ? ? ? ? Cardiovascular ?hypertension, Normal cardiovascular exam ?Rhythm:Regular Rate:Normal ? ?ECG 11/16/21:  ?Sinus rhythm ?Probable left atrial enlargement ?Left axis deviation ?Consider anterior infarct ?  ?Neuro/Psych ?PSYCHIATRIC DISORDERS Anxiety Depression Dementia negative neurological ROS ?   ? GI/Hepatic ?GERD  ,  ?Endo/Other  ?negative endocrine ROS ? Renal/GU ?Renal disease (stage III CKD)  ? ?  ?Musculoskeletal ? ?(+) Arthritis ,  ? Abdominal ?  ?Peds ? Hematology ?negative hematology ROS ?(+)   ?Anesthesia Other Findings ? ? Reproductive/Obstetrics ? ?  ? ? ? ? ? ? ? ? ? ? ? ? ? ?  ?  ? ? ? ? ? ? ? ?Anesthesia Physical ?Anesthesia Plan ? ?ASA: 3 ? ?Anesthesia Plan: General and Spinal  ? ?Post-op Pain Management:   ? ?Induction: Intravenous ? ?PONV Risk Score and Plan: 3 and Propofol infusion, TIVA, Treatment may vary due to age or medical condition and Ondansetron ? ?Airway Management Planned: Natural Airway and Nasal Cannula ? ?Additional Equipment:  ? ?Intra-op Plan:  ? ?Post-operative Plan:  ? ?Informed Consent: I have reviewed the patients History and Physical, chart, labs and discussed the procedure including the risks, benefits and alternatives for the proposed anesthesia with the patient or authorized representative who has indicated his/her understanding and acceptance.  ? ? ? ?Consent reviewed with POA ? ?Plan Discussed with: CRNA ? ?Anesthesia  Plan Comments: (Plan for spinal and GA with natural airway, LMA/GETA backup.  Patient and daughter (at bedside) consented for risks of anesthesia including but not limited to:  ?- adverse reactions to medications ?- damage to eyes, teeth, lips or other oral mucosa ?- nerve damage due to positioning  ?- sore throat or hoarseness ?- headache, bleeding, infection, nerve damage 2/2 spinal ?- damage to heart, brain, nerves, lungs, other parts of body or loss of life ? ?Informed patient and daughter about role of CRNA in peri- and intra-operative care.  Patient voiced understanding.)  ? ? ? ? ? ?Anesthesia Quick Evaluation ? ?

## 2021-11-18 ENCOUNTER — Encounter: Payer: Self-pay | Admitting: Orthopedic Surgery

## 2021-11-18 DIAGNOSIS — S72002A Fracture of unspecified part of neck of left femur, initial encounter for closed fracture: Secondary | ICD-10-CM | POA: Diagnosis not present

## 2021-11-18 LAB — BASIC METABOLIC PANEL
Anion gap: 9 (ref 5–15)
BUN: 28 mg/dL — ABNORMAL HIGH (ref 8–23)
CO2: 25 mmol/L (ref 22–32)
Calcium: 8.5 mg/dL — ABNORMAL LOW (ref 8.9–10.3)
Chloride: 106 mmol/L (ref 98–111)
Creatinine, Ser: 1.74 mg/dL — ABNORMAL HIGH (ref 0.44–1.00)
GFR, Estimated: 30 mL/min — ABNORMAL LOW (ref >60)
Glucose, Bld: 150 mg/dL — ABNORMAL HIGH (ref 70–99)
Potassium: 3.9 mmol/L (ref 3.5–5.1)
Sodium: 140 mmol/L (ref 135–145)

## 2021-11-18 LAB — CBC
HCT: 33 % — ABNORMAL LOW (ref 36.0–46.0)
Hemoglobin: 10.9 g/dL — ABNORMAL LOW (ref 12.0–15.0)
MCH: 32.2 pg (ref 26.0–34.0)
MCHC: 33 g/dL (ref 30.0–36.0)
MCV: 97.3 fL (ref 80.0–100.0)
Platelets: 201 10*3/uL (ref 150–400)
RBC: 3.39 MIL/uL — ABNORMAL LOW (ref 3.87–5.11)
RDW: 11.4 % — ABNORMAL LOW (ref 11.5–15.5)
WBC: 9.4 10*3/uL (ref 4.0–10.5)
nRBC: 0 % (ref 0.0–0.2)

## 2021-11-18 LAB — MAGNESIUM: Magnesium: 2.3 mg/dL (ref 1.7–2.4)

## 2021-11-18 LAB — PHOSPHORUS: Phosphorus: 4.8 mg/dL — ABNORMAL HIGH (ref 2.5–4.6)

## 2021-11-18 NOTE — Progress Notes (Addendum)
?  Progress Note ? ? ?Patient: Julie Davis [id number] DOB: 04-Dec-1945 DOA: 11/16/2021     2 ? ?DOS: the patient was seen and examined on 11/18/2021 ?  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 CT of the head showed no acute intracranial findings.  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 ?CT cervical spine without contrast: No acute fracture or listhesis of the cervical spine. ?Left complete knee x-rays: Mild tricompartment arthritis.  No acute osseous abnormality. ?Left humerus: Negative. ?Left hip x-ray: Acute slightly angulated left femoral intertrochanteric fracture. ?Patient was admitted for left intertrochanteric femur fracture, orthopedic is consulted, Patient underwent ORIF postoperative day 1, tolerated well.  PT and OT recommended SNF. ? ?Assessment and Plan: ?* Closed left hip fracture ([place]) ?Patient presented s/p mechanical fall. ?X-ray shows left femur intertrochanteric fracture. ?Continue adequate pain control ?Orthopedic consulted, Patient underwent ORIF 4/26.  Postoperative day 1. ?TOC consulted for SNF and DME. ? ?Depression with anxiety ?Continue Paxil daily and alprazolam as needed ? ?Essential hypertension ?Continue amlodipine and losartan. ? ?Leukocytosis ?Likely reactive.  Continue to monitor ? ?Dementia ([place]) ?Continue galantamine , Namenda ? ?AKI (acute kidney injury) ([place]) ?Serum creatinine slightly up 1.74 from prior 1.4 which is baseline. ?Likely prerenal.,  Continue IV hydration, avoid nephrotoxic medication. ?Recheck a.m. labs ? ? ?Subjective: Patient was seen and examined at bedside.  Overnight events noted. ?Patient reports having pain in the left hip area otherwise  doing better.  She had ORIF yesterday,  postoperative day 1. ? ?Physical Exam: ?Vitals:  ? 11/17/21 2048 11/18/21 0445 11/18/21 0752 11/18/21 1122  ?BP: (!) 130/56 140/64 (!) 131/54 132/67  ?Pulse: 71 72 72 77  ?Resp: 16 16 17 19   ?Temp: 98.7 ?F (37.1 ?C) 98.2 ?F (36.8 ?C) 98.6 ?F (37 ?C) 98.5 ?F (36.9 ?C)  ?TempSrc: Oral Oral Oral Oral  ?SpO2: 97% 96% 96% 95%  ?Weight:      ?Height:      ? ?General exam: Appears comfortable, not in any acute distress.  Deconditioned ?Respiratory system: CTA bilaterally, no wheezing, no crackles, normal respiratory effort. ?Cardiovascular system: S1-S2 heard, regular rate and rhythm, no murmur. ?Gastrointestinal system: Abdomen is  soft, non tender, non distended, BS+ ?Central nervous system: Alert, oriented x 3, no focal neurological deficits. ?Extremities: Left leg tenderness, restricted movement.  No edema ?Psychiatry: Mood, insight, judgment normal. ? ?Data Reviewed: ?I have Reviewed nursing notes, Vitals, and Lab results since pt's last encounter. Pertinent lab results CBC, BMP ?I have ordered test including CBC, BMP ?I have reviewed the last note from orthopedics,  ?I have discussed pt's care plan and test results with patient.  ? ?Family Communication: No family at bedside ? ?Disposition: ?Status is: Inpatient ?Remains inpatient appropriate because:  ? ?Admitted s/p mechanical fall with left femur intertrochanteric fracture.  Orthopedics consulted patient is a scheduled for ORIF today. ? ? ? Planned Discharge Destination: SNF versus HH ? ? ? ? ?Time spent: 35 minutes ? ?Author: ?Shawna Clamp, MD ?11/18/2021 1:32 PM ? ?For on call review [email or web address].  ?

## 2021-11-18 NOTE — Evaluation (Signed)
Physical Therapy Evaluation ?Patient Details ?Name: Julie Davis ?MRN: [id number] ?DOB: 1946-04-28 ?Today's Date: 11/18/2021 ? ?History of Present Illness ? Patient is a  76 y.o. female who complains of left hip pain after mechanical fall. found to have displaced left intertrochanteric hip fracture, s/p intramedullary fixatio. History of dementia  ?Clinical Impression ? Patient pleasant and cooperative with PT evaluation. She has some confusion but is able to follow commands with increased time. She reports she is independent with mobility prior to admission and lives with her spouse and daughter.  ?Today, the patient required assistance for standing. She was able to ambulate a short distance in the room with rolling walker but needs assistance and maximal cues for sequencing of the walker and BLE. She is fatigued with activity and reports 5/10 pain in left hip with walking and minimal to no pain at rest. Recommend to continue PT to maximize independence and facilitate return to prior level of function. As patient is not at her baseline level of mobility, SNF is recommended at discharge at this time.  ?   ? ?Recommendations for follow up therapy are one component of a multi-disciplinary discharge planning process, led by the attending physician.  Recommendations may be updated based on patient status, additional functional criteria and insurance authorization. ? ?Follow Up Recommendations Skilled nursing-short term rehab (<3 hours/day) ? ?  ?Assistance Recommended at Discharge Frequent or constant Supervision/Assistance  ?Patient can return home with the following ? A lot of help with walking and/or transfers;A lot of help with bathing/dressing/bathroom;Assist for transportation;Help with stairs or ramp for entrance ? ?  ?Equipment Recommendations None recommended by PT  ?Recommendations for Other Services ?    ?  ?Functional Status Assessment Patient has had a recent decline in their functional status and  demonstrates the ability to make significant improvements in function in a reasonable and predictable amount of time.  ? ?  ?Precautions / Restrictions Precautions ?Precautions: Fall ?Precaution Comments: hip fracture HEP provided ?Restrictions ?Weight Bearing Restrictions: Yes ?LLE Weight Bearing: Weight bearing as tolerated  ? ?  ? ?Mobility ? Bed Mobility ?  ?  ?  ?  ?  ?  ?  ?General bed mobility comments: not observed as patient sitting up on arrival and post session ?  ? ?Transfers ?Overall transfer level: Needs assistance ?  ?Transfers: Sit to/from Stand ?Sit to Stand: Mod assist ?  ?  ?  ?  ?  ?General transfer comment: multiple standing bouts performed from recliner chair. patient needs verbal cues for hand placement and sequencing for standing with limited carry over demonstrated ?  ? ?Ambulation/Gait ?Ambulation/Gait assistance: Min assist, Mod assist ?Gait Distance (Feet): 10 Feet ?Assistive device: Rolling walker (2 wheels) (with chair follow for safety) ?Gait Pattern/deviations: Step-to pattern ?Gait velocity: decreased ?  ?Pre-gait activities: weight shifting performed with faciliation using rolling walker for support to assess readiness for walking in preparation for gait training ?General Gait Details: patient required up to moderate assistance for weight shifting for advancement of BLE. she also needs frequent cues for sequencing of BLE and rolling walker with difficulty with carry over without constant reminders. unable to ambulate further due to fatigue and pain in LLE with weight bearing ? ?Stairs ?  ?  ?  ?  ?  ? ?Wheelchair Mobility ?  ? ?Modified Rankin (Stroke Patients Only) ?  ? ?  ? ?Balance Overall balance assessment: Needs assistance, History of Falls ?Sitting-balance support: Feet supported ?Sitting balance-Leahy Scale: Good ?Sitting  balance - Comments: patient does guard seated movement at times due to LLE pain ?  ?Standing balance support: Bilateral upper extremity supported, During  functional activity, Reliant on assistive device for balance ?Standing balance-Leahy Scale: Poor ?Standing balance comment: Min A- Mod A required to maintain standing balance with dynamic activity. close stand by assistance for static standing ?  ?  ?  ?  ?  ?  ?  ?  ?  ?  ?  ?   ? ? ? ?Pertinent Vitals/Pain Pain Assessment ?Pain Assessment: 0-10 ?Pain Score: 5  (with ambulation) ?Pain Descriptors / Indicators: Discomfort, Sore ?Pain Intervention(s): Limited activity within patient's tolerance  ? ? ?Home Living Family/patient expects to be discharged to:: Private residence ?Living Arrangements: Spouse/significant other (daughter) ?Available Help at Discharge: Family ?Type of Home: House ?Home Access: Stairs to enter ?Entrance Stairs-Rails: Right;Left;Can reach both ?Entrance Stairs-Number of Steps: she thinks it is 2-3 steps but is unsure ?  ?  ?Home Equipment: Agricultural consultantolling Walker (2 wheels) ?Additional Comments: per notes, patient has a rolling walker but does not use it  ?  ?Prior Function Prior Level of Function : Independent/Modified Independent;History of Falls (last six months) ?  ?  ?  ?  ?  ?  ?Mobility Comments: independent without assistive device. patient does not drive. only one fall reported recently ?ADLs Comments: independent ?  ? ? ?Hand Dominance  ?   ? ?  ?Extremity/Trunk Assessment  ? Upper Extremity Assessment ?Upper Extremity Assessment: Overall WFL for tasks assessed ?  ? ?Lower Extremity Assessment ?Lower Extremity Assessment: LLE deficits/detail (RLE WFL with functional observation) ?LLE Deficits / Details: patient able to activate hip/knee/ankle movement. no knee buckling with ambulation ?LLE Sensation: WNL ?  ? ?   ?Communication  ? Communication: No difficulties  ?Cognition Arousal/Alertness: Awake/alert ?Behavior During Therapy: [facility] for tasks assessed/performed ?Overall Cognitive Status: History of cognitive impairments - at baseline ?  ?  ?  ?  ?  ?  ?  ?  ?  ?  ?  ?  ?  ?  ?  ?  ?General  Comments: patient is oriented to self and siutation. she thinks she is in rehab. unaware of date/time ?  ?  ? ?  ?General Comments   ? ?  ?Exercises    ? ?Assessment/Plan  ?  ?PT Assessment Patient needs continued PT services  ?PT Problem List Decreased range of motion;Decreased strength;Decreased activity tolerance;Decreased balance;Decreased mobility;Pain;Decreased knowledge of precautions;Decreased safety awareness;Decreased knowledge of use of DME;Decreased cognition ? ?   ?  ?PT Treatment Interventions DME instruction;Gait training;Stair training;Functional mobility training;Therapeutic activities;Therapeutic exercise;Balance training;Neuromuscular re-education;Cognitive remediation;Patient/family education   ? ?PT Goals (Current goals can be found in the Care Plan section)  ?Acute Rehab PT Goals ?Patient Stated Goal: to regain independence ?PT Goal Formulation: With patient ?Time For Goal Achievement: 12/02/21 ?Potential to Achieve Goals: Good ? ?  ?Frequency BID ?  ? ? ?Co-evaluation PT/OT/SLP Co-Evaluation/Treatment: Yes ?Reason for Co-Treatment: To address functional/ADL transfers ?PT goals addressed during session: Mobility/safety with mobility ?OT goals addressed during session: ADL's and self-care ?  ? ? ?  ?AM-PAC PT "6 Clicks" Mobility  ?Outcome Measure Help needed turning from your back to your side while in a flat bed without using bedrails?: A Little ?Help needed moving from lying on your back to sitting on the side of a flat bed without using bedrails?: A Lot ?Help needed moving to and from a bed to a chair (including a  wheelchair)?: A Lot ?Help needed standing up from a chair using your arms (e.g., wheelchair or bedside chair)?: A Lot ?Help needed to walk in hospital room?: A Lot ?Help needed climbing 3-5 steps with a railing? : A Lot ?6 Click Score: 13 ? ?  ?End of Session Equipment Utilized During Treatment: Gait belt ?Activity Tolerance: Patient tolerated treatment well ?Patient left: in  chair;with call bell/phone within reach;with chair alarm set ?  ?PT Visit Diagnosis: Other abnormalities of gait and mobility (R26.89);Pain;Unsteadiness on feet (R26.81) ?Pain - Right/Left: Left ?Pain - part of body:

## 2021-11-18 NOTE — Assessment & Plan Note (Addendum)
Serum creatinine slightly up 1.74 from prior 1.4 which is baseline. ?Likely prerenal.,  Continue IV hydration, avoid nephrotoxic medication. ?A.m. labs shows improved serum creatinine 1.54. ?Recheck labs in 3 to 4 days. ?

## 2021-11-18 NOTE — Progress Notes (Signed)
Physical Therapy Treatment ?Patient Details ?Name: Julie Davis ?MRN: [id number] ?DOB: [birth year] ?Today's Date: 11/18/2021 ? ? ?History of Present Illness Patient is a  76 y.o. female who complains of left hip pain after mechanical fall. found to have displaced left intertrochanteric hip fracture, s/p intramedullary fixation. History of dementia ? ?  ?PT Comments  ? ? Patient agreeable to PT. Supportive daughter at the bedside. Patient agreeable to in bed exercises which were performed on LLE with AAROM. Patient reports minimal pain in left hip. Ice pack provided. Recommend to continue PT to maximize independence and facilitate return to prior level of function. SNF is recommended at discharge.  ?  ?Recommendations for follow up therapy are one component of a multi-disciplinary discharge planning process, led by the attending physician.  Recommendations may be updated based on patient status, additional functional criteria and insurance authorization. ? ?Follow Up Recommendations ? Skilled nursing-short term rehab (<3 hours/day) ?  ?  ?Assistance Recommended at Discharge Frequent or constant Supervision/Assistance  ?Patient can return home with the following A lot of help with walking and/or transfers;A lot of help with bathing/dressing/bathroom;Assist for transportation;Help with stairs or ramp for entrance ?  ?Equipment Recommendations ? None recommended by PT  ?  ?Recommendations for Other Services   ? ? ?  ?Precautions / Restrictions Precautions ?Precautions: Fall ?Restrictions ?Weight Bearing Restrictions: Yes ?LLE Weight Bearing: Weight bearing as tolerated  ?  ? ?Mobility ? Bed Mobility ?  ?  ?  ?  ?  ?  ?  ?General bed mobility comments: patient declined out of bed. agreeable to in bed exercises ?  ? ?Transfers ?Overall transfer level: Needs assistance ?  ?Transfers: Sit to/from Stand ?Sit to Stand: Mod assist ?  ?  ?  ?  ?  ?General transfer comment: multiple standing bouts performed from recliner chair.  patient needs verbal cues for hand placement and sequencing for standing with limited carry over demonstrated ?  ? ?Ambulation/Gait ?Ambulation/Gait assistance: Min assist, Mod assist ?Gait Distance (Feet): 10 Feet ?Assistive device: Rolling walker (2 wheels) (with chair follow for safety) ?Gait Pattern/deviations: Step-to pattern ?Gait velocity: decreased ?  ?Pre-gait activities: weight shifting performed with faciliation using rolling walker for support to assess readiness for walking in preparation for gait training ?General Gait Details: patient required up to moderate assistance for weight shifting for advancement of BLE. she also needs frequent cues for sequencing of BLE and rolling walker with difficulty with carry over without constant reminders. unable to ambulate further due to fatigue and pain in LLE with weight bearing ? ? ?Stairs ?  ?  ?  ?  ?  ? ? ?Wheelchair Mobility ?  ? ?Modified Rankin (Stroke Patients Only) ?  ? ? ?  ?Balance Overall balance assessment: Needs assistance, History of Falls ?Sitting-balance support: Feet supported ?Sitting balance-Leahy Scale: Good ?Sitting balance - Comments: patient does guard seated movement at times due to LLE pain ?  ?Standing balance support: Bilateral upper extremity supported, During functional activity, Reliant on assistive device for balance ?Standing balance-Leahy Scale: Poor ?Standing balance comment: Min A- Mod A required to maintain standing balance with dynamic activity. close stand by assistance for static standing ?  ?  ?  ?  ?  ?  ?  ?  ?  ?  ?  ?  ? ?  ?Cognition Arousal/Alertness: Awake/alert ?Behavior During Therapy: [facility] for tasks assessed/performed ?Overall Cognitive Status: History of cognitive impairments - at baseline ?  ?  ?  ?  ?  ?  ?  ?  ?  ?  ?  ?  ?  ?  ?  ?  ?  General Comments: patient is agreeable to PT ?  ?  ? ?  ?Exercises Total Joint Exercises ?Short Arc Quad: AAROM, Strengthening, Left, 10 reps, Supine ?Heel Slides: AAROM,  Strengthening, Left, 10 reps, Supine ?Hip ABduction/ADduction: AAROM, Strengthening, Left, 10 reps, Supine ?Straight Leg Raises: AAROM, Strengthening, Left, 10 reps, Supine ?Other Exercises ?Other Exercises: verbal and visual cues for exercise technique ? ?  ?General Comments   ?  ?  ? ?Pertinent Vitals/Pain Pain Assessment ?Pain Assessment: Faces ?Pain Score: 5  (with ambulation) ?Faces Pain Scale: Hurts a little bit ?Pain Location: LLE ?Pain Descriptors / Indicators: Discomfort, Sore ?Pain Intervention(s): Limited activity within patient's tolerance, Ice applied (ice pack applied to left hip at end of session)  ? ? ?Home Living   ?  ?  ?  ?  ?  ?  ?  ?  ?  ?   ?  ?Prior Function    ?  ?  ?   ? ?PT Goals (current goals can now be found in the care plan section) Acute Rehab PT Goals ?Patient Stated Goal: to regain independence ?PT Goal Formulation: With patient ?Time For Goal Achievement: 12/02/21 ?Potential to Achieve Goals: Good ?Progress towards PT goals: Progressing toward goals ? ?  ?Frequency ? ? ? BID ? ? ? ?  ?PT Plan Current plan remains appropriate  ? ? ?Co-evaluation   ?  ?  ?  ?  ? ?  ?AM-PAC PT "6 Clicks" Mobility   ?Outcome Measure ? Help needed turning from your back to your side while in a flat bed without using bedrails?: A Little ?Help needed moving from lying on your back to sitting on the side of a flat bed without using bedrails?: A Lot ?Help needed moving to and from a bed to a chair (including a wheelchair)?: A Lot ?Help needed standing up from a chair using your arms (e.g., wheelchair or bedside chair)?: A Lot ?Help needed to walk in hospital room?: A Lot ?Help needed climbing 3-5 steps with a railing? : A Lot ?6 Click Score: 13 ? ?  ?End of Session   ?Activity Tolerance: Patient tolerated treatment well ?Patient left: in bed;with call bell/phone within reach;with bed alarm set;with nursing/sitter in room;with family/visitor present ?Nurse Communication: Mobility status ?PT Visit Diagnosis:  Other abnormalities of gait and mobility (R26.89);Pain;Unsteadiness on feet (R26.81) ?Pain - Right/Left: Left ?Pain - part of body: Hip ?  ? ? ?Time: 1610-9604 ?PT Time Calculation (min) (ACUTE ONLY): 20 min ? ?Charges:  $Therapeutic Exercise: 8-22 mins          ?          ? ?Donna Bernard, PT, MPT ? ? ? ?Ina Homes ?11/18/2021, 4:12 PM ? ?

## 2021-11-18 NOTE — Progress Notes (Signed)
?Subjective: ? ?POD #1 s/p intramedullary fixation for left intertrochanteric hip fracture.   Patient reports left hip pain as mild.  Daughter is at the bedside. ? ?Objective:  ? ?VITALS:   ?Vitals:  ? 11/18/21 0445 11/18/21 0752 11/18/21 1122 11/18/21 1534  ?BP: 140/64 (!) 131/54 132/67 (!) 138/51  ?Pulse: 72 72 77 76  ?Resp: ?Temp: 98.2 ?F (36.8 ?C) 98.6 ?F (37 ?C) 98.5 ?F (36.9 ?C) 98.6 ?F (37 ?C)  ?TempSrc: Oral Oral Oral Oral  ?SpO2: 96% 96% 95% 95%  ?Weight:      ?Height:      ? ? ?PHYSICAL EXAM: ?Left lower extremity ?Neurovascular intact ?Sensation intact distally ?Intact pulses distally ?Dorsiflexion/Plantar flexion intact ?Incision: dressing C/D/I ?No cellulitis present ?Compartment soft ? ?LABS ? ?Results for orders placed or performed during the hospital encounter of 11/16/21 (from the past 24 hour(s))  ?CBC     Status: Abnormal  ? Collection Time: 11/18/21  6:22 AM  ?Result Value Ref Range  ? WBC 9.4 4.0 - 10.5 K/uL  ? RBC 3.39 (L) 3.87 - 5.11 MIL/uL  ? Hemoglobin 10.9 (L) 12.0 - 15.0 g/dL  ? HCT 33.0 (L) 36.0 - 46.0 %  ? MCV 97.3 80.0 - 100.0 fL  ? MCH 32.2 26.0 - 34.0 pg  ? MCHC 33.0 30.0 - 36.0 g/dL  ? RDW 11.4 (L) 11.5 - 15.5 %  ? Platelets 201 150 - 400 K/uL  ? nRBC 0.0 0.0 - 0.2 %  ?Magnesium     Status: None  ? Collection Time: 11/18/21  6:22 AM  ?Result Value Ref Range  ? Magnesium 2.3 1.7 - 2.4 mg/dL  ?Basic metabolic panel     Status: Abnormal  ? Collection Time: 11/18/21  6:22 AM  ?Result Value Ref Range  ? Sodium 140 135 - 145 mmol/L  ? Potassium 3.9 3.5 - 5.1 mmol/L  ? Chloride 106 98 - 111 mmol/L  ? CO2 25 22 - 32 mmol/L  ? Glucose, Bld 150 (H) 70 - 99 mg/dL  ? BUN 28 (H) 8 - 23 mg/dL  ? Creatinine, Ser 1.74 (H) 0.44 - 1.00 mg/dL  ? Calcium 8.5 (L) 8.9 - 10.3 mg/dL  ? GFR, Estimated 30 (L) >60 mL/min  ? Anion gap 9 5 - 15  ?Phosphorus     Status: Abnormal  ? Collection Time: 11/18/21  6:22 AM  ?Result Value Ref Range  ? Phosphorus 4.8 (H) 2.5 - 4.6 mg/dL  ? ? ?CT HEAD WO  CONTRAST ( ) ? ?Result Date: 11/16/2021 ?CLINICAL DATA:  Fall EXAM: CT HEAD WITHOUT CONTRAST TECHNIQUE: Contiguous axial images were obtained from the base of the skull through the vertex without intravenous contrast. RADIATION DOSE REDUCTION: This exam was performed according to the departmental dose-optimization program which includes automated exposure control, adjustment of the mA and/or kV according to patient size and/or use of iterative reconstruction technique. COMPARISON:  None. FINDINGS: Brain: No acute intracranial hemorrhage. No focal mass lesion. No CT evidence of acute infarction. No midline shift or mass effect. No hydrocephalus. Basilar cisterns are patent. There are periventricular and subcortical white matter hypodensities. Generalized cortical atrophy. Ex vacuo dilatation of the LEFT ventricle atria unchanged from prior. Vascular: No hyperdense vessel or unexpected calcification. Skull: Normal. Negative for fracture or focal lesion. Sinuses/Orbits: Paranasal sinuses and mastoid air cells are clear. Orbits are clear. Other: None. IMPRESSION: 1. No acute intracranial findings. 2. 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 Electronically Signed  By: Genevive Bi M.D.   On: 11/16/2021 17:37  ? ?CT Cervical Spine Wo Contrast ? ?Result Date: 11/16/2021 ?CLINICAL DATA:  Fall, neck trauma EXAM: CT CERVICAL SPINE WITHOUT CONTRAST TECHNIQUE: Multidetector CT imaging of the cervical spine was performed without intravenous contrast. Multiplanar CT image reconstructions were also generated. RADIATION DOSE REDUCTION: This exam was performed according to the departmental dose-optimization program which includes automated exposure control, adjustment of the mA and/or kV according to patient size and/or use of iterative reconstruction technique. COMPARISON:  None. FINDINGS: Alignment: There is straightening of the cervical spine. 2 mm anterolisthesis C4-5 is likely degenerative in nature. Skull  base and vertebrae: No acute fracture of the cervical spine. Vertebral body height is preserved. Craniocervical alignment is normal. Landau dental interval is not widened. 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 Mild flattening of the thecal sac no canal hematoma. No prevertebral soft tissue swelling. No paravertebral fluid collections. Extensive right and moderate left carotid bifurcation calcifications. No pathologic cervical adenopathy. Visualized salivary glands are unremarkable. Thyroid gland unremarkable. Disc levels: There is intervertebral disc space narrowing and endplate remodeling at C4-T1, most severe at C6-7 in keeping with changes of mild to moderate degenerative disc disease. Prevertebral soft tissues are not thickened. Review of the axial images demonstrates multilevel uncovertebral and facet arthrosis resulting in mild-to-moderate left neuroforaminal narrowing at C6-7. Upper chest: Negative. Other: None IMPRESSION: No acute fracture or listhesis of the cervical spine. 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 Degenerative disc and degenerative joint disease as described above resulting in mild-to-moderate left neuroforaminal narrowing at C6-7. Peripheral vascular disease with bilateral carotid artery calcification. If indicated, this may be better assessed with dedicated carotid artery Doppler sonography. Electronically Signed   By: Helyn Numbers M.D.   On: 11/16/2021 17:41  ? ?DG Knee Complete 4 Views Left ? ?Result Date: 11/16/2021 ?CLINICAL DATA:  Trauma fall EXAM: LEFT KNEE - COMPLETE 4+ VIEW COMPARISON:  None. FINDINGS: No fracture or malalignment. Mild tricompartment arthritis of the knee. No significant effusion IMPRESSION: Mild  tricompartment arthritis.  No acute osseous abnormality. Electronically Signed   By: Jasmine Pang M.D.   On: 11/16/2021 17:11  ? ?DG Humerus Left ? ?Result Date: 11/16/2021 ?CLINICAL DATA:  Fall EXAM: LEFT HUMERUS - 2+ VIEW COMPARISON:  None. FINDINGS: There is no evidence of fracture or other focal bone lesions. Soft tissues are unremarkable. IMPRESSION: Negative. Electronically Signed   By: Jasmine Pang M.D.   On: 11/16/2021 17:10  ? ?DG C-Arm 1-60 Min-No Report ? ?Result Date: 11/17/2021 ?Fluoroscopy was utilized by the requesting physician.  No radiographic interpretation.  ? ?DG Hip Port Unilat With Pelvis 1V Left ? ?Result Date: 11/17/2021 ?CLINICAL DATA:  Postop intramedullary nail. EXAM: DG HIP (WITH OR WITHOUT PELVIS) 1V PORT LEFT COMPARISON:  Preoperative radiograph yesterday. FINDINGS: Left femoral intramedullary nail with distal locking and trans trochanteric screw fixation of proximal femur fracture. Improved fracture alignment from preoperative imaging. Recent postsurgical change includes air and edema in the soft tissues with lateral skin staples. IMPRESSION: ORIF of left femoral fracture without immediate postoperative complication. Electronically Signed   By: Narda Rutherford M.D.   On: 11/17/2021 18:31  ? ?DG HIP UNILAT WITH PELVIS 2-3 VIEWS LEFT ? ?Result Date: 11/17/2021 ?CLINICAL DATA:  ORIF left hip  fracture EXAM: DG HIP (WITH OR WITHOUT PELVIS) 2-3V LEFT; DG C-ARM 1-60 MIN-NO REPORT COMPARISON:  11/16/2021 FINDINGS: Four C-arm images were obtained the left hip. Left femoral neck fracture is been fixed with a compression screw and locking intramedullary nail extending into the mid femur. Satisfactory fracture alignment. IMPRESSION: Satisfactory ORIF left femoral neck fracture. Electronically Signed   By: Marlan Palauharles  Clark M.D.   On: 11/17/2021 16:57  ? ?DG Hip Unilat W or Wo Pelvis 2-3 Views Left ? ?Result Date: 11/16/2021 ?CLINICAL DATA:  Left hip pain EXAM: DG HIP (WITH OR WITHOUT PELVIS) 2-3V LEFT  COMPARISON:  None. FINDINGS: SI joints are non widened. Pubic symphysis and rami appear intact. Acute left intertrochanteric fracture with varus angulation. No femoral head dislocation IMPRESSION: Acute slightly

## 2021-11-18 NOTE — TOC Progression Note (Signed)
Transition of Care (TOC) - Progression Note  ? ? ?Patient Details  ?Name: Julie Davis ?MRN: [id number] ?Date of Birth: [birth year] ? ?Transition of Care (TOC) CM/SW Contact  ?Marlowe Saxeliliah J Ellie Bryand, RN ?Phone Number: ?11/18/2021, 11:17 AM ? ?Clinical Narrative:    ?Spoke to the daughter [name], reviewed the bed offers with her, She said she heard good and bad about both ?She chose [facility] ?I notified tammy at [facility], Ins Pending ? ? ?Expected Discharge Plan: Skilled Nursing Facility ?Barriers to Discharge: Continued Medical Work up ? ?Expected Discharge Plan and Services ?Expected Discharge Plan: Skilled Nursing Facility ?  ?Discharge Planning Services: CM Consult ?Post Acute Care Choice: Skilled Nursing Facility ?Living arrangements for the past 2 months: Single Family Home ?                ?DME Arranged: N/A ?DME Agency: NA ?  ?  ?  ?HH Arranged: NA ?HH Agency: NA ?  ?  ?  ? ? ?Social Determinants of Health (SDOH) Interventions ?  ? ?Readmission Risk Interventions ?   ? View : No data to display.  ?  ?  ?  ? ? ?

## 2021-11-18 NOTE — Evaluation (Signed)
Occupational Therapy Evaluation ?Patient Details ?Name: Julie Davis ?MRN: [id number] ?DOB: 12/30/1945 ?Today's Date: 11/18/2021 ? ? ?History of Present Illness Patient is a  76 y.o. female who complains of left hip pain after mechanical fall. found to have displaced left intertrochanteric hip fracture, s/p intramedullary fixation. History of dementia  ? ?Clinical Impression ?  ?Ms Rho was seen for OT/PT co-evaluation this date. Prior to hospital admission, pt was Independent for mobility and ADLs, uses depends at baseline. Pt lives with daughter and spouse in home c ~2 STE. Pt presents to acute OT demonstrating impaired ADL performance and functional mobility 2/2 decreased activity tolerance and functional strength/ROM/balance deficits. Pt currently requires MOD A don briefs sit<>stand - assist to thread over L foot and standing portion, cues for sequencing. MOD A + RW for simulated BSC t/f. SETUP self-feeding seated in chair. Pt would benefit from skilled OT to address noted impairments and functional limitations (see below for any additional details). Upon hospital discharge, recommend STR to maximize pt safety and return to PLOF. ?  ?   ? ?Recommendations for follow up therapy are one component of a multi-disciplinary discharge planning process, led by the attending physician.  Recommendations may be updated based on patient status, additional functional criteria and insurance authorization.  ? ?Follow Up Recommendations ? Skilled nursing-short term rehab (<3 hours/day)  ?  ?Assistance Recommended at Discharge Frequent or constant Supervision/Assistance  ?Patient can return home with the following A lot of help with walking and/or transfers;A lot of help with bathing/dressing/bathroom;Help with stairs or ramp for entrance ? ?  ?Functional Status Assessment ? Patient has had a recent decline in their functional status and demonstrates the ability to make significant improvements in function in a reasonable  and predictable amount of time.  ?Equipment Recommendations ? BSC/3in1  ?  ?Recommendations for Other Services   ? ? ?  ?Precautions / Restrictions Precautions ?Precautions: Fall ?Precaution Comments: hip fracture HEP provided ?Restrictions ?Weight Bearing Restrictions: Yes ?LLE Weight Bearing: Weight bearing as tolerated  ? ?  ? ?Mobility Bed Mobility ?  ?  ?  ?  ?  ?  ?  ?General bed mobility comments: not observed as patient sitting up on arrival and post session ?  ? ?Transfers ?Overall transfer level: Needs assistance ?  ?Transfers: Sit to/from Stand ?Sit to Stand: Mod assist ?  ?  ?  ?  ?  ?General transfer comment: multiple standing bouts performed from recliner chair. patient needs verbal cues for hand placement and sequencing for standing with limited carry over demonstrated ?  ? ?  ?Balance Overall balance assessment: Needs assistance, History of Falls ?Sitting-balance support: Feet supported ?Sitting balance-Leahy Scale: Good ?  ?  ?Standing balance support: Bilateral upper extremity supported, During functional activity, Reliant on assistive device for balance ?Standing balance-Leahy Scale: Poor ?Standing balance comment: Min A- Mod A required to maintain standing balance with dynamic activity. close stand by assistance for static standing ?  ?  ?  ?  ?  ?  ?  ?  ?  ?  ?  ?   ? ?ADL either performed or assessed with clinical judgement  ? ?ADL Overall ADL's : Needs assistance/impaired ?  ?  ?  ?  ?  ?  ?  ?  ?  ?  ?  ?  ?  ?  ?  ?  ?  ?  ?  ?General ADL Comments: MOD A don briefs sit<>stand - assist to thread  over L foot and standing portion, cues for sequencing. MOD A + RW for simulated BSC t/f. SETUP self-feeding seated in chair  ? ? ? ? ?Pertinent Vitals/Pain Pain Assessment ?Pain Assessment: 0-10 ?Pain Score: 5  ?Pain Location: LLE ?Pain Descriptors / Indicators: Discomfort, Sore ?Pain Intervention(s): Limited activity within patient's tolerance  ? ? ? ?Hand Dominance   ?  ?Extremity/Trunk Assessment  Upper Extremity Assessment ?Upper Extremity Assessment: Overall WFL for tasks assessed ?  ?Lower Extremity Assessment ?Lower Extremity Assessment: Defer to PT evaluation ?LLE Deficits / Details: patient able to activate hip/knee/ankle movement. no knee buckling with ambulation ?LLE Sensation: WNL ?  ?  ?  ?Communication Communication ?Communication: No difficulties ?  ?Cognition Arousal/Alertness: Awake/alert ?Behavior During Therapy: [facility] for tasks assessed/performed ?Overall Cognitive Status: History of cognitive impairments - at baseline ?  ?  ?  ?  ?  ?  ?  ?  ?  ?  ?  ?  ?  ?  ?  ?  ?General Comments: A&O to self and siutation. Poor short term memory and recall of instructions. Requires hand over hand and repeated cues ?  ?  ? ?Home Living Family/patient expects to be discharged to:: Private residence ?Living Arrangements: Spouse/significant other (daughter) ?Available Help at Discharge: Family ?Type of Home: House ?Home Access: Stairs to enter ?Entrance Stairs-Number of Steps: she thinks it is 2-3 steps but is unsure ?Entrance Stairs-Rails: Right;Left;Can reach both ?  ?  ?  ?  ?  ?  ?  ?  ?Home Equipment: Conservation officer, nature (2 wheels) ?  ?Additional Comments: per notes, patient has a rolling walker but does not use it ?  ? ?  ?Prior Functioning/Environment Prior Level of Function : Independent/Modified Independent;History of Falls (last six months) ?  ?  ?  ?  ?  ?  ?Mobility Comments: independent without assistive device. patient does not drive. only one fall reported recently ?ADLs Comments: Wears depends ?  ? ?  ?  ?OT Problem List: Decreased strength;Decreased range of motion;Decreased activity tolerance;Impaired balance (sitting and/or standing);Decreased safety awareness ?  ?   ?OT Treatment/Interventions: Self-care/ADL training;Therapeutic exercise;Energy conservation;DME and/or AE instruction;Therapeutic activities;Patient/family education;Balance training  ?  ?OT Goals(Current goals can be found in the  care plan section) Acute Rehab OT Goals ?Patient Stated Goal: to walk ?OT Goal Formulation: With patient ?Time For Goal Achievement: 12/02/21 ?Potential to Achieve Goals: Good ?ADL Goals ?Pt Will Perform Grooming: with modified independence;standing ?Pt Will Perform Lower Body Dressing: with supervision;with set-up;sit to/from stand ?Pt Will Transfer to Toilet: ambulating;regular height toilet;with min guard assist  ?OT Frequency: Min 2X/week ?  ? ?Co-evaluation PT/OT/SLP Co-Evaluation/Treatment: Yes ?Reason for Co-Treatment: Necessary to address cognition/behavior during functional activity ?PT goals addressed during session: Mobility/safety with mobility ?OT goals addressed during session: ADL's and self-care ?  ? ?  ?AM-PAC OT "6 Clicks" Daily Activity     ?Outcome Measure Help from another person eating meals?: None ?Help from another person taking care of personal grooming?: A Little ?Help from another person toileting, which includes using toliet, bedpan, or urinal?: A Lot ?Help from another person bathing (including washing, rinsing, drying)?: A Lot ?Help from another person to put on and taking off regular upper body clothing?: A Little ?Help from another person to put on and taking off regular lower body clothing?: A Lot ?6 Click Score: 16 ?  ?End of Session Equipment Utilized During Treatment: Rolling walker (2 wheels) ?Nurse Communication: Mobility status ? ?Activity Tolerance: Patient  tolerated treatment well ?Patient left: in chair;with call bell/phone within reach;with chair alarm set ? ?OT Visit Diagnosis: Other abnormalities of gait and mobility (R26.89);Muscle weakness (generalized) (M62.81)  ?              ?Time: [id number] ?OT Time Calculation (min): 26 min ?Charges:  OT General Charges ?$OT Visit: 1 Visit ?OT Evaluation ?$OT Eval Low Complexity: 1 Low ?OT Treatments ?$Self Care/Home Management : 8-22 mins ? ?Dessie Coma, M.S. OTR/L  ?11/18/21, 10:34 AM  ?ascom [phone] ? ?

## 2021-11-19 DIAGNOSIS — S72002A Fracture of unspecified part of neck of left femur, initial encounter for closed fracture: Secondary | ICD-10-CM | POA: Diagnosis not present

## 2021-11-19 LAB — BASIC METABOLIC PANEL
Anion gap: 6 (ref 5–15)
BUN: 28 mg/dL — ABNORMAL HIGH (ref 8–23)
CO2: 27 mmol/L (ref 22–32)
Calcium: 8.6 mg/dL — ABNORMAL LOW (ref 8.9–10.3)
Chloride: 110 mmol/L (ref 98–111)
Creatinine, Ser: 1.54 mg/dL — ABNORMAL HIGH (ref 0.44–1.00)
GFR, Estimated: 35 mL/min — ABNORMAL LOW (ref >60)
Glucose, Bld: 105 mg/dL — ABNORMAL HIGH (ref 70–99)
Potassium: 3.7 mmol/L (ref 3.5–5.1)
Sodium: 143 mmol/L (ref 135–145)

## 2021-11-19 LAB — CBC
HCT: 33.6 % — ABNORMAL LOW (ref 36.0–46.0)
Hemoglobin: 10.9 g/dL — ABNORMAL LOW (ref 12.0–15.0)
MCH: 31.4 pg (ref 26.0–34.0)
MCHC: 32.4 g/dL (ref 30.0–36.0)
MCV: 96.8 fL (ref 80.0–100.0)
Platelets: 208 10*3/uL (ref 150–400)
RBC: 3.47 MIL/uL — ABNORMAL LOW (ref 3.87–5.11)
RDW: 11.5 % (ref 11.5–15.5)
WBC: 11.7 10*3/uL — ABNORMAL HIGH (ref 4.0–10.5)
nRBC: 0 % (ref 0.0–0.2)

## 2021-11-19 LAB — PHOSPHORUS: Phosphorus: 2.9 mg/dL (ref 2.5–4.6)

## 2021-11-19 LAB — MAGNESIUM: Magnesium: 2.1 mg/dL (ref 1.7–2.4)

## 2021-11-19 MED ORDER — METHOCARBAMOL 500 MG PO TABS: 500.0000 mg | ORAL_TABLET | Freq: Four times a day (QID) | ORAL | 0 refills | Status: AC | PRN

## 2021-11-19 MED ORDER — TRAMADOL HCL 50 MG PO TABS: 50.0000 mg | ORAL_TABLET | Freq: Four times a day (QID) | ORAL | 0 refills | Status: AC

## 2021-11-19 MED ORDER — HYDROCODONE-ACETAMINOPHEN 5-325 MG PO TABS
1.0000 | ORAL_TABLET | ORAL | 0 refills | Status: AC | PRN
Start: 2021-11-19 — End: ?

## 2021-11-19 MED ORDER — ASPIRIN EC 81 MG PO TBEC: 81.0000 mg | DELAYED_RELEASE_TABLET | Freq: Two times a day (BID) | ORAL | 0 refills | Status: AC

## 2021-11-19 NOTE — TOC Progression Note (Signed)
Transition of Care (TOC) - Progression Note  ? ? ?Patient Details  ?Name: Julie Davis ?MRN: [id number] ?Date of Birth: 10-27-1945 ? ?Transition of Care (TOC) CM/SW Contact  ?Conception Oms, RN ?Phone Number: ?11/19/2021, 10:54 AM ? ?Clinical Narrative:    ? ?Reached out to check te status of the Ins auth that is pending, awaiting a response ? ?Expected Discharge Plan: [place] ?Barriers to Discharge: Continued Medical Work up ? ?Expected Discharge Plan and Services ?Expected Discharge Plan: [place] ?  ?Discharge Planning Services: CM Consult ?Post Acute Care Choice: [place] ?Living arrangements for the past 2 months: [place] ?Expected Discharge Date: 11/19/21               ?DME Arranged: N/A ?DME Agency: NA ?  ?  ?  ?HH Arranged: NA ?[place] Agency: NA ?  ?  ?  ? ? ?Social Determinants of Health (SDOH) Interventions ?  ? ?Readmission Risk Interventions ?   ? View : No data to display.  ?  ?  ?  ? ? ?

## 2021-11-19 NOTE — Progress Notes (Signed)
EMS arrived. EMS transported pt from [facility] to [facility]. ?

## 2021-11-19 NOTE — Progress Notes (Signed)
?Subjective: ? ?POD #2 s/p IM fixation for left hip IT fracture.   Patient reports left hip pain as mild.  Patient up out of bed to a chair.  Daughter is at the bedside. ? ?Objective:  ? ?VITALS:   ?Vitals:  ? 11/18/21 1927 11/18/21 2337 11/19/21 0334 11/19/21 0803  ?BP: (!) 131/48 (!) 180/63 (!) 143/70 (!) 138/50  ?Pulse: 68 82 69 66  ?Resp: 15 15 16 17   ?Temp: 99 ?F (37.2 ?C) 98.1 ?F (36.7 ?C) 98.2 ?F (36.8 ?C) 98.4 ?F (36.9 ?C)  ?TempSrc:    Oral  ?SpO2: 95% 98% 94% 94%  ?Weight:      ?Height:      ? ? ?PHYSICAL EXAM: ?Left lower extremity ?Neurovascular intact ?Sensation intact distally ?Intact pulses distally ?Dorsiflexion/Plantar flexion intact ?Incision: dressing C/D/I ?No cellulitis present ?Compartment soft ? ?LABS ? ?Results for orders placed or performed during the hospital encounter of 11/16/21 (from the past 24 hour(s))  ?CBC     Status: Abnormal  ? Collection Time: 11/19/21  5:03 AM  ?Result Value Ref Range  ? WBC 11.7 (H) 4.0 - 10.5 K/uL  ? RBC 3.47 (L) 3.87 - 5.11 MIL/uL  ? Hemoglobin 10.9 (L) 12.0 - 15.0 g/dL  ? HCT 33.6 (L) 36.0 - 46.0 %  ? MCV 96.8 80.0 - 100.0 fL  ? MCH 31.4 26.0 - 34.0 pg  ? MCHC 32.4 30.0 - 36.0 g/dL  ? RDW 11.5 11.5 - 15.5 %  ? Platelets 208 150 - 400 K/uL  ? nRBC 0.0 0.0 - 0.2 %  ?Magnesium     Status: None  ? Collection Time: 11/19/21  5:03 AM  ?Result Value Ref Range  ? Magnesium 2.1 1.7 - 2.4 mg/dL  ?Phosphorus     Status: None  ? Collection Time: 11/19/21  5:03 AM  ?Result Value Ref Range  ? Phosphorus 2.9 2.5 - 4.6 mg/dL  ?Basic metabolic panel     Status: Abnormal  ? Collection Time: 11/19/21  5:03 AM  ?Result Value Ref Range  ? Sodium 143 135 - 145 mmol/L  ? Potassium 3.7 3.5 - 5.1 mmol/L  ? Chloride 110 98 - 111 mmol/L  ? CO2 27 22 - 32 mmol/L  ? Glucose, Bld 105 (H) 70 - 99 mg/dL  ? BUN 28 (H) 8 - 23 mg/dL  ? Creatinine, Ser 1.54 (H) 0.44 - 1.00 mg/dL  ? Calcium 8.6 (L) 8.9 - 10.3 mg/dL  ? GFR, Estimated 35 (L) >60 mL/min  ? Anion gap 6 5 - 15  ? ? ?DG C-Arm 1-60  Min-No Report ? ?Result Date: 11/17/2021 ?Fluoroscopy was utilized by the requesting physician.  No radiographic interpretation.  ? ?DG Hip Port Unilat With Pelvis 1V Left ? ?Result Date: 11/17/2021 ?CLINICAL DATA:  Postop intramedullary nail. EXAM: DG HIP (WITH OR WITHOUT PELVIS) 1V PORT LEFT COMPARISON:  Preoperative radiograph yesterday. FINDINGS: Left femoral intramedullary nail with distal locking and trans trochanteric screw fixation of proximal femur fracture. Improved fracture alignment from preoperative imaging. Recent postsurgical change includes air and edema in the soft tissues with lateral skin staples. IMPRESSION: ORIF of left femoral fracture without immediate postoperative complication. Electronically Signed   By: Keith Rake M.D.   On: 11/17/2021 18:31  ? ?DG HIP UNILAT WITH PELVIS 2-3 VIEWS LEFT ? ?Result Date: 11/17/2021 ?CLINICAL DATA:  ORIF left hip fracture EXAM: DG HIP (WITH OR WITHOUT PELVIS) 2-3V LEFT; DG C-ARM 1-60 MIN-NO REPORT COMPARISON:  11/16/2021 FINDINGS: Four C-arm images  were obtained the left hip. Left femoral neck fracture is been fixed with a compression screw and locking intramedullary nail extending into the mid femur. Satisfactory fracture alignment. IMPRESSION: Satisfactory ORIF left femoral neck fracture. Electronically Signed   By: Franchot Gallo M.D.   On: 11/17/2021 16:57   ? ?Assessment/Plan: ?2 Days Post-Op  ? ?Principal Problem: ?  Closed left hip fracture ([place]) ?Active Problems: ?  AKI (acute kidney injury) ([place]) ?  Dementia ([place]) ?  Leukocytosis ?  Essential hypertension ?  Depression with anxiety ?  Stage 3b chronic kidney disease (CKD) ([place]) ? ?  ?Patient is doing well from an orthopedic standpoint.  Continue with physical therapy.  Patient has been recommended for skilled nursing facility.  Patient may be discharged to a skilled nursing facility whenever she is cleared medically.  Patient will follow-up in my office in 10 to 14 days after discharge for wound  check and staple removal.  Patient is weightbearing as tolerated on the left lower extremity and will continue physical therapy at the skilled nursing facility for left hip range of motion, lower extremity strengthening and gait training.  Patient should be discharged on Lovenox daily or enteric-coated aspirin 81 mg p.o. twice daily for DVT prophylaxis until follow-up in the orthopedic clinic. ?  ? ? ? ?Thornton Park , MD ?11/19/2021, 2:21 PM ? ? ? ? ?  ?

## 2021-11-19 NOTE — Progress Notes (Signed)
Contacted and reported to Nurse Morrie Sheldon at [facility] ?

## 2021-11-19 NOTE — TOC Progression Note (Signed)
Transition of Care (TOC) - Progression Note  ? ? ?Patient Details  ?Name: Julie Davis ?MRN: [id number] ?Date of Birth: February 25, 1946 ? ?Transition of Care (TOC) CM/SW Contact  ?Marlowe Sax, RN ?Phone Number: ?11/19/2021, 12:48 PM ? ?Clinical Narrative:    ?Going to room [address] [name] her daughter  is awrare ?[facility] ?EMS to transport ?called ? And she is number  ?4 on the list ?Expected Discharge Plan: Skilled Nursing Facility ?Barriers to Discharge: Continued Medical Work up ? ?Expected Discharge Plan and Services ?Expected Discharge Plan: Skilled Nursing Facility ?  ?Discharge Planning Services: CM Consult ?Post Acute Care Choice: Skilled Nursing Facility ?Living arrangements for the past 2 months: Single Family Home ?Expected Discharge Date: 11/19/21               ?DME Arranged: N/A ?DME Agency: NA ?  ?  ?  ?HH Arranged: NA ?HH Agency: NA ?  ?  ?  ? ? ?Social Determinants of Health (SDOH) Interventions ?  ? ?Readmission Risk Interventions ?   ? View : No data to display.  ?  ?  ?  ? ? ?

## 2021-11-19 NOTE — Care Management Important Message (Signed)
Important Message ? ?Patient Details  ?Name: Julie Davis ?MRN: [id number] ?Date of Birth: 1946/04/27 ? ? ?Medicare Important Message Given:  N/A - LOS <3 / Initial given by admissions ? ? ? ? ?Julie Davis ?11/19/2021, 9:52 AM ?

## 2021-11-19 NOTE — Progress Notes (Signed)
Physical Therapy Treatment ?Patient Details ?Name: Julie Davis ?MRN: [id number] ?DOB: 08-01-1945 ?Today's Date: 11/19/2021 ? ? ?History of Present Illness Patient is a  76 y.o. female who complains of left hip pain after mechanical fall. found to have displaced left intertrochanteric hip fracture, s/p intramedullary fixation. History of dementia ? ?  ?PT Comments  ? ? Pt received up in recliner, agreeable to PT. Minimal c/o L hip discomfort, increased tolerance for mobility. Pt tolerated 6185ft with RW and CGA for safety. Overall good demonstration of weight shifting to L side and progressed into a step through gait pattern. Pt remains very motivated, plan to d/c to SNF prior to returning home with husband. ?  ?Recommendations for follow up therapy are one component of a multi-disciplinary discharge planning process, led by the attending physician.  Recommendations may be updated based on patient status, additional functional criteria and insurance authorization. ? ?Follow Up Recommendations ? Skilled nursing-short term rehab (<3 hours/day) ?  ?  ?Assistance Recommended at Discharge Frequent or constant Supervision/Assistance  ?Patient can return home with the following A lot of help with walking and/or transfers;A lot of help with bathing/dressing/bathroom;Assist for transportation;Help with stairs or ramp for entrance ?  ?Equipment Recommendations ? None recommended by PT  ?  ?Recommendations for Other Services   ? ? ?  ?Precautions / Restrictions Precautions ?Precautions: Fall ?Precaution Comments: hip fracture HEP provided ?Restrictions ?Weight Bearing Restrictions: Yes ?LLE Weight Bearing: Weight bearing as tolerated  ?  ? ?Mobility ? Bed Mobility ?  ?  ?  ?  ?  ?  ?  ?General bed mobility comments: Pt received in chair ?  ? ?Transfers ?Overall transfer level: Needs assistance ?Equipment used: Rolling walker (2 wheels) ?Transfers: Sit to/from Stand ?Sit to Stand: Mod assist ?  ?  ?  ?  ?  ?General transfer  comment: vc's for proper technique ?  ? ?Ambulation/Gait ?Ambulation/Gait assistance: Min guard ?Gait Distance (Feet): 85 Feet ?Assistive device: Rolling walker (2 wheels) ?Gait Pattern/deviations: Step-to pattern ?Gait velocity: decreased ?  ?  ?General Gait Details:  (Improved gait tolerance and distance) ? ? ?Stairs ?  ?  ?  ?  ?  ? ? ?Wheelchair Mobility ?  ? ?Modified Rankin (Stroke Patients Only) ?  ? ? ?  ?Balance   ?  ?  ?  ?  ?  ?  ?  ?  ?  ?  ?  ?  ?  ?  ?  ?  ?  ?  ?  ? ?  ?Cognition Arousal/Alertness: Awake/alert ?Behavior During Therapy: [facility] for tasks assessed/performed ?Overall Cognitive Status: History of cognitive impairments - at baseline ?  ?  ?  ?  ?  ?  ?  ?  ?  ?  ?  ?  ?  ?  ?  ?  ?General Comments: Pt with short term memory deficits, repeated herself throughout session. ?  ?  ? ?  ?Exercises Total Joint Exercises ?Ankle Circles/Pumps: AROM, Both, 15 reps ?Long Arc Quad: AROM, Both, 10 reps ?General Exercises - Lower Extremity ?Hip Flexion/Marching: AROM, Right, AAROM, Left, 10 reps, Seated ? ?  ?General Comments   ?  ?  ? ?Pertinent Vitals/Pain Pain Assessment ?Pain Assessment: Faces ?Faces Pain Scale: Hurts a little bit ?Pain Location: LLE ?Pain Descriptors / Indicators: Discomfort, Sore ?Pain Intervention(s): Monitored during session, Premedicated before session  ? ? ?Home Living   ?  ?  ?  ?  ?  ?  ?  ?  ?  ?   ?  ?  Prior Function    ?  ?  ?   ? ?PT Goals (current goals can now be found in the care plan section) Acute Rehab PT Goals ?Patient Stated Goal: to regain independence ? ?  ?Frequency ? ? ? BID ? ? ? ?  ?PT Plan Current plan remains appropriate  ? ? ?Co-evaluation   ?  ?  ?  ?  ? ?  ?AM-PAC PT "6 Clicks" Mobility   ?Outcome Measure ? Help needed turning from your back to your side while in a flat bed without using bedrails?: A Little ?Help needed moving from lying on your back to sitting on the side of a flat bed without using bedrails?: A Lot ?Help needed moving to and from a bed to  a chair (including a wheelchair)?: A Lot ?Help needed standing up from a chair using your arms (e.g., wheelchair or bedside chair)?: A Lot ?Help needed to walk in hospital room?: A Lot ?Help needed climbing 3-5 steps with a railing? : A Lot ?6 Click Score: 13 ? ?  ?End of Session Equipment Utilized During Treatment: Gait belt ?Activity Tolerance: Patient tolerated treatment well ?Patient left: in bed;with call bell/phone within reach;with bed alarm set;with nursing/sitter in room;with family/visitor present ?Nurse Communication: Mobility status ?PT Visit Diagnosis: Other abnormalities of gait and mobility (R26.89);Pain;Unsteadiness on feet (R26.81) ?Pain - Right/Left: Left ?Pain - part of body: Hip ?  ? ? ?Time:  -  ?  ? ?Charges:             ?          ?Zadie Cleverly, PTA ? ? ? ?Jannet Askew ?11/19/2021, 4:10 PM ? ?

## 2021-11-19 NOTE — Discharge Summary (Signed)
?Physician Discharge Summary ?  ?Patient: Julie Davis MRN: [id number] DOB: [birth year]  ?Admit date:     11/16/2021  ?Discharge date: 11/19/21  ?Discharge Physician: Shawna Clamp  ? ?PCP: Julie Aus, MD  ? ?Recommendations at discharge:  ?Advised to follow-up with primary care physician in 1 week. ?Advised to follow-up with orthopedics as scheduled. ?Advised to take aspirin 81 mg twice daily for 4 weeks for prophylaxis against DVT. ?Advised to continue other medications as prescribed. ? ?Discharge Diagnoses: ?Principal Problem: ?  Closed left hip fracture ([place]) ?Active Problems: ?  AKI (acute kidney injury) ([place]) ?  Dementia ([place]) ?  Leukocytosis ?  Essential hypertension ?  Depression with anxiety ?  Stage 3b chronic kidney disease (CKD) ([place]) ? ?Resolved Problems: ?  * No resolved hospital problems. * ? 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 CT of the head showed no acute intracranial findings.  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 ?CT cervical spine without contrast: No acute fracture or listhesis of the cervical spine. ?Left complete knee x-rays: Mild tricompartment arthritis.  No acute osseous abnormality. ?Left humerus: Negative. ?Left hip x-ray: Acute slightly angulated left femoral intertrochanteric fracture. ?Patient was admitted for left intertrochanteric femur fracture, orthopedic is consulted, Patient underwent ORIF postoperative day 2, tolerated well.  PT and OT recommended SNF.  Patient has been doing better patient is cleared from orthopedics to be discharged to SNF.  Patient is being discharged to SNF for rehab.  Patient will continue aspirin 81 mg twice daily for postoperative DVT  prophylaxis.  Patient to follow-up with orthopedics in 2 weeks. ? ?Assessment and Plan: ?* Closed left hip fracture ([place]) ?Patient presented s/p mechanical fall. ?X-ray shows left femur intertrochanteric fracture. ?Continue adequate pain control ?Orthopedic consulted, Patient underwent ORIF 4/26.  Postoperative day 2. ?PT recommended SNF.  Patient is being discharged to SNF for rehab ? ?Depression with anxiety ?Continue Paxil daily and alprazolam as needed ? ?Essential hypertension ?Continue amlodipine and losartan. ? ?Leukocytosis ?Likely reactive.  Continue to monitor ? ?Dementia ([place]) ?Continue galantamine , Namenda ? ?AKI (acute kidney injury) ([place]) ?Serum creatinine slightly up 1.74 from prior 1.4 which is baseline. ?Likely prerenal.,  Continue IV hydration, avoid nephrotoxic medication. ?A.m. labs shows improved serum creatinine 1.54. ?Recheck labs in 3 to 4 days. ? ? ? ?Pain control - [employer] Controlled Substance Reporting System database was reviewed. and patient was instructed, not to drive, operate heavy machinery, perform activities at heights, swimming or participation in water activities or provide baby-sitting services while on Pain, Sleep and Anxiety Medications; until their outpatient Physician has advised to do so again. Also recommended to not to take more than prescribed Pain, Sleep and Anxiety Medications.  ? ?Consultants: Orthopedics ?Procedures performed:ORIF ?Disposition: Skilled nursing facility ?Diet recommendation:  ?Discharge Diet Orders (From admission, onward)  ? ?  Start     Ordered  ? 11/19/21 0000  Diet - low sodium heart healthy       ? 11/19/21 1015  ? 11/19/21 0000  Diet Carb Modified       ? 11/19/21 1015  ? ?  ?  ? ?  ? ?Carb modified diet ?DISCHARGE MEDICATION: ?Allergies as of 11/19/2021   ? ?   Reactions  ? Methotrexate Derivatives Diarrhea  ? ?  ? ?  ?  Medication List  ?  ? ?STOP taking these medications   ? ?ALPRAZolam 0.5 MG tablet ?Commonly known as: XANAX ?   ?donepezil 10 MG tablet ?Commonly known as: ARICEPT ?  ?escitalopram 10 MG tablet ?Commonly known as: LEXAPRO ?  ? ?  ? ?TAKE these medications   ? ?amLODipine 10 MG tablet ?Commonly known as: NORVASC ?Take 10 mg by mouth daily. ?  ?aspirin EC 81 MG tablet ?Take 1 tablet (81 mg total) by mouth in the morning and at bedtime. Swallow whole. ?  ?atorvastatin 10 MG tablet ?Commonly known as: LIPITOR ?Take 10 mg by mouth daily. ?  ?ergocalciferol 1.25 MG (50000 UT) capsule ?Commonly known as: VITAMIN D2 ?Take 50,000 Units by mouth every Monday. ?  ?galantamine 4 MG tablet ?Commonly known as: RAZADYNE ?Take 4 mg by mouth 2 (two) times daily. ?  ?HYDROcodone-acetaminophen 5-325 MG tablet ?Commonly known as: NORCO/VICODIN ?Take 1-2 tablets by mouth every 4 (four) hours as needed for moderate pain (pain score 4-6). ?  ?losartan 25 MG tablet ?Commonly known as: COZAAR ?Take 25 mg by mouth daily. ?  ?memantine 5 MG tablet ?Commonly known as: NAMENDA ?Take 5 mg by mouth 2 (two) times daily. ?  ?methocarbamol 500 MG tablet ?Commonly known as: ROBAXIN ?Take 1 tablet (500 mg total) by mouth every 6 (six) hours as needed for muscle spasms. ?  ?pantoprazole 40 MG tablet ?Commonly known as: PROTONIX ?Take 40 mg by mouth daily. ?  ?PARoxetine 20 MG tablet ?Commonly known as: PAXIL ?Take 20 mg by mouth daily. ?  ?traMADol 50 MG tablet ?Commonly known as: ULTRAM ?Take 100 mg by mouth 3 (three) times daily as needed for moderate pain or severe pain. ?What changed: Another medication with the same name was added. Make sure you understand how and when to take each. ?  ?traMADol 50 MG tablet ?Commonly known as: ULTRAM ?Take 1 tablet (50 mg total) by mouth every 6 (six) hours. ?What changed: You were already taking a medication with the same name, and this prescription was added. Make sure you understand how and when to take each. ?  ?vitamin B-12 1000 MCG tablet ?Commonly known as: CYANOCOBALAMIN ?Take 1,000 mcg by mouth daily. ?  ?vitamin  k 100 MCG tablet ?Take 100 mcg by mouth daily. ?Notes to patient: Not given in hospital ?  ? ?  ? ?  ?  ? ? ?  ?Discharge Care Instructions  ?(From admission, onward)  ?  ? ? ?  ? ?  Start     Ordered  ? 11/19/21 0000  Discharge wound care:       ?Comments: Follow up orthopeadics.  ? 11/19/21 1015  ? ?  ?  ? ?  ? ? Contact information for follow-up providers   ? ? Julie Aus, MD Follow up in 1 week(s).   ?Specialty: Internal Medicine ?Contact information: ?[address] ?[place] [address] ?[phone] ? ? ?  ?  ? ? Thornton Park, MD Follow up in 2 week(s).   ?Specialty: Orthopedic Surgery ?Contact information: ?[place] [address] ?[phone] ? ? ?  ?  ? ?  ?  ? ? Contact information for after-discharge care   ? ? Destination   ? ? [facility] .   ?Service: Skilled Nursing ?Contact information: ?[address] ?[place] ?[phone] ? ?  ?  ? ?  ?  ? ?  ?  ? ?  ? ?  Discharge Exam: ?Filed Weights  ? 11/16/21 1620 11/17/21 1411  ?Weight: 77.1 kg 77.1 kg  ? ?General exam: Appears comfortable, not in any acute distress.  Deconditioned ?Respiratory system: CTA bilaterally, no wheezing, no crackles, normal respiratory effort. ?Cardiovascular system: S1-S2 heard, regular rate and rhythm, no murmur. ?Gastrointestinal system: Abdomen is soft, non tender, non distended, BS+ ?Central nervous system: Alert, oriented x3, no focal neurological deficits. ?Extremities: Left hip tenderness+, able to bend legs.  Status post ORIF ?Psychiatry: Mood, insight, judgment normal ? ? ?Condition at discharge: good ? ?The results of significant diagnostics from this hospitalization (including imaging, microbiology, ancillary and laboratory) are listed below for reference.  ? ?Imaging Studies: ?CT HEAD WO CONTRAST (5MM) ? ?Result Date: 11/16/2021 ?CLINICAL DATA:  Fall EXAM: CT HEAD WITHOUT CONTRAST TECHNIQUE: Contiguous axial images were obtained  from the base of the skull through the vertex without intravenous contrast. RADIATION DOSE REDUCTION: This exam was performed according to the departmental dose-optimization program which includes automated

## 2021-11-19 NOTE — Discharge Instructions (Signed)
Advised to follow-up with primary care physician in 1 week. ?Advised to follow-up with orthopedics as scheduled. ?Advised to take aspirin 81 mg twice daily for 4 weeks for prophylaxis against DVT. ?Advised to continue other medications as prescribed ?

## 2021-11-22 NOTE — Anesthesia Postprocedure Evaluation (Signed)
Anesthesia Post Note ? ?Patient: Julie Davis ? ?Procedure(s) Performed: INTRAMEDULLARY (IM) NAIL INTERTROCHANTRIC (Left: Hip) ? ?Patient location during evaluation: PACU ?Anesthesia Type: Spinal ?Level of consciousness: awake and alert ?Pain management: pain level controlled ?Vital Signs Assessment: post-procedure vital signs reviewed and stable ?Respiratory status: spontaneous breathing, nonlabored ventilation and respiratory function stable ?Cardiovascular status: blood pressure returned to baseline and stable ?Postop Assessment: no apparent nausea or vomiting ?Anesthetic complications: no ? ? ?No notable events documented. ? ? ?Last Vitals:  ?Vitals:  ? 11/19/21 1527 11/19/21 1703  ?BP: 128/64 (!) 149/47  ?Pulse: 65 67  ?Resp: 16 15  ?Temp: (!) 36.4 ?C 36.4 ?C  ?SpO2: 95% 100%  ?  ?Last Pain:  ?Vitals:  ? 11/19/21 1703  ?TempSrc: Oral  ?PainSc:   ? ? ?  ?  ?  ?  ?  ?  ? ?Foye Deer ? ? ? ? ?

## 2022-03-03 ENCOUNTER — Other Ambulatory Visit: Payer: Self-pay | Admitting: Internal Medicine

## 2022-03-03 DIAGNOSIS — Z1231 Encounter for screening mammogram for malignant neoplasm of breast: Secondary | ICD-10-CM

## 2022-03-30 ENCOUNTER — Ambulatory Visit
Admission: RE | Admit: 2022-03-30 | Discharge: 2022-03-30 | Disposition: A | Discharge status: Home / Self Care | Payer: Medicare HMO | Source: Ambulatory Visit | Attending: Internal Medicine | Admitting: Internal Medicine

## 2022-03-30 DIAGNOSIS — Z1231 Encounter for screening mammogram for malignant neoplasm of breast: Secondary | ICD-10-CM | POA: Insufficient documentation

## 2022-09-23 DEATH — deceased
